# Patient Record
Sex: Male | Born: 1960 | Race: Black or African American | Hispanic: No | Marital: Married | Smoking: Never smoker
Health system: Southern US, Community
[De-identification: ages and names within clinical notes are randomized; demographics above are authoritative.]

## PROBLEM LIST (undated history)

## (undated) ENCOUNTER — Ambulatory Visit (HOSPITAL_COMMUNITY): Discharge status: Absent without leave | Payer: BC Managed Care – PPO

## (undated) DIAGNOSIS — C61 Malignant neoplasm of prostate: Secondary | ICD-10-CM

## (undated) DIAGNOSIS — I10 Essential (primary) hypertension: Secondary | ICD-10-CM

## (undated) DIAGNOSIS — G4730 Sleep apnea, unspecified: Secondary | ICD-10-CM

## (undated) HISTORY — DX: Sleep apnea, unspecified: G47.30

## (undated) HISTORY — DX: Morbid (severe) obesity due to excess calories: E66.01

## (undated) HISTORY — PX: KNEE SURGERY: SHX244

## (undated) HISTORY — PX: ESOPHAGEAL DILATION: SHX303

## (undated) HISTORY — PX: CARPAL TUNNEL RELEASE: SHX101

## (undated) HISTORY — DX: Malignant neoplasm of prostate: C61

---

## 1998-08-08 ENCOUNTER — Emergency Department (HOSPITAL_COMMUNITY): Admission: EM | Admit: 1998-08-08 | Discharge: 1998-08-08 | Payer: Self-pay | Admitting: Emergency Medicine

## 1999-02-02 ENCOUNTER — Encounter: Payer: Self-pay | Admitting: Emergency Medicine

## 1999-02-02 ENCOUNTER — Emergency Department (HOSPITAL_COMMUNITY): Admission: EM | Admit: 1999-02-02 | Discharge: 1999-02-02 | Payer: Self-pay | Admitting: Emergency Medicine

## 1999-06-25 ENCOUNTER — Ambulatory Visit (HOSPITAL_COMMUNITY): Admission: RE | Admit: 1999-06-25 | Discharge: 1999-06-25 | Payer: Self-pay | Admitting: Infectious Diseases

## 1999-06-25 ENCOUNTER — Encounter: Payer: Self-pay | Admitting: Infectious Diseases

## 2000-08-23 ENCOUNTER — Emergency Department (HOSPITAL_COMMUNITY): Admission: EM | Admit: 2000-08-23 | Discharge: 2000-08-23 | Payer: Self-pay | Admitting: Emergency Medicine

## 2000-08-23 ENCOUNTER — Encounter: Payer: Self-pay | Admitting: Emergency Medicine

## 2002-05-30 ENCOUNTER — Encounter: Payer: Self-pay | Admitting: Emergency Medicine

## 2002-05-30 ENCOUNTER — Emergency Department (HOSPITAL_COMMUNITY): Admission: EM | Admit: 2002-05-30 | Discharge: 2002-05-30 | Payer: Self-pay | Admitting: Emergency Medicine

## 2002-08-14 ENCOUNTER — Ambulatory Visit (HOSPITAL_COMMUNITY): Admission: RE | Admit: 2002-08-14 | Discharge: 2002-08-14 | Payer: Self-pay | Admitting: *Deleted

## 2003-11-05 ENCOUNTER — Emergency Department (HOSPITAL_COMMUNITY): Admission: EM | Admit: 2003-11-05 | Discharge: 2003-11-05 | Payer: Self-pay | Admitting: Emergency Medicine

## 2004-01-16 ENCOUNTER — Emergency Department (HOSPITAL_COMMUNITY): Admission: EM | Admit: 2004-01-16 | Discharge: 2004-01-16 | Payer: Self-pay | Admitting: Family Medicine

## 2004-06-01 ENCOUNTER — Emergency Department (HOSPITAL_COMMUNITY): Admission: EM | Admit: 2004-06-01 | Discharge: 2004-06-01 | Payer: Self-pay | Admitting: Family Medicine

## 2004-06-21 ENCOUNTER — Emergency Department (HOSPITAL_COMMUNITY): Admission: EM | Admit: 2004-06-21 | Discharge: 2004-06-21 | Payer: Self-pay | Admitting: Emergency Medicine

## 2004-08-13 ENCOUNTER — Ambulatory Visit (HOSPITAL_COMMUNITY): Admission: RE | Admit: 2004-08-13 | Discharge: 2004-08-13 | Payer: Self-pay | Admitting: *Deleted

## 2004-12-25 ENCOUNTER — Emergency Department (HOSPITAL_COMMUNITY): Admission: EM | Admit: 2004-12-25 | Discharge: 2004-12-25 | Payer: Self-pay | Admitting: Family Medicine

## 2005-11-03 ENCOUNTER — Emergency Department (HOSPITAL_COMMUNITY): Admission: EM | Admit: 2005-11-03 | Discharge: 2005-11-03 | Payer: Self-pay | Admitting: Emergency Medicine

## 2006-05-12 ENCOUNTER — Ambulatory Visit (HOSPITAL_BASED_OUTPATIENT_CLINIC_OR_DEPARTMENT_OTHER): Admission: RE | Admit: 2006-05-12 | Discharge: 2006-05-12 | Payer: Self-pay | Admitting: Internal Medicine

## 2006-05-15 ENCOUNTER — Ambulatory Visit: Payer: Self-pay | Admitting: Internal Medicine

## 2007-02-08 ENCOUNTER — Ambulatory Visit (HOSPITAL_BASED_OUTPATIENT_CLINIC_OR_DEPARTMENT_OTHER): Admission: RE | Admit: 2007-02-08 | Discharge: 2007-02-08 | Payer: Self-pay | Admitting: Internal Medicine

## 2007-02-12 ENCOUNTER — Ambulatory Visit: Payer: Self-pay | Admitting: Internal Medicine

## 2007-03-28 ENCOUNTER — Emergency Department (HOSPITAL_COMMUNITY): Admission: EM | Admit: 2007-03-28 | Discharge: 2007-03-28 | Payer: Self-pay | Admitting: Family Medicine

## 2007-05-18 ENCOUNTER — Inpatient Hospital Stay (HOSPITAL_COMMUNITY): Admission: EM | Admit: 2007-05-18 | Discharge: 2007-05-19 | Payer: Self-pay | Admitting: Emergency Medicine

## 2007-11-08 ENCOUNTER — Emergency Department (HOSPITAL_COMMUNITY): Admission: EM | Admit: 2007-11-08 | Discharge: 2007-11-08 | Payer: Self-pay | Admitting: Family Medicine

## 2008-04-22 ENCOUNTER — Emergency Department (HOSPITAL_COMMUNITY): Admission: EM | Admit: 2008-04-22 | Discharge: 2008-04-22 | Payer: Self-pay | Admitting: Emergency Medicine

## 2009-08-22 ENCOUNTER — Ambulatory Visit (HOSPITAL_COMMUNITY): Admission: RE | Admit: 2009-08-22 | Discharge: 2009-08-22 | Payer: Self-pay | Admitting: Gastroenterology

## 2009-08-22 ENCOUNTER — Encounter (INDEPENDENT_AMBULATORY_CARE_PROVIDER_SITE_OTHER): Payer: Self-pay | Admitting: Gastroenterology

## 2010-06-13 ENCOUNTER — Emergency Department (HOSPITAL_COMMUNITY): Admission: EM | Admit: 2010-06-13 | Discharge: 2010-06-14 | Payer: Self-pay | Admitting: Emergency Medicine

## 2010-07-02 ENCOUNTER — Emergency Department (HOSPITAL_COMMUNITY): Admission: EM | Admit: 2010-07-02 | Discharge: 2010-07-02 | Payer: Self-pay | Admitting: Emergency Medicine

## 2010-08-19 ENCOUNTER — Ambulatory Visit (HOSPITAL_BASED_OUTPATIENT_CLINIC_OR_DEPARTMENT_OTHER): Admission: RE | Admit: 2010-08-19 | Discharge: 2010-08-19 | Payer: Self-pay | Admitting: Orthopedic Surgery

## 2011-02-18 LAB — POCT HEMOGLOBIN-HEMACUE: Hemoglobin: 15.8 g/dL (ref 13.0–17.0)

## 2011-04-23 NOTE — Procedures (Signed)
Logan Contreras, Logan Contreras             ACCOUNT NO.:  [id number]   MEDICAL RECORD NO.:  [id number]          PATIENT TYPE:  OUT   LOCATION:  SLEEP CENTER                 FACILITY:  [facility]   PHYSICIAN:  Clinton D. Maple Hudson, MD, FCCP, FACPDATE OF BIRTH:  04-23-59   DATE OF STUDY:  02/08/2007                            NOCTURNAL POLYSOMNOGRAM   INDICATION FOR STUDY:  Hypersomnia with sleep apnea.   EPWORTH SLEEPINESS SCORE:  14/24, BMI 32.8, weight 240 pounds.   MEDICATIONS:  Diovan.   A baseline diagnostic study on May 12, 2006 recorded an AHI of 65.2 per  hour.  CPAP titration is requested.   SLEEP ARCHITECTURE:  Sleep architecture:  Total sleep time 282 minutes  with sleep efficiency 76%, stage I with 13%, stage II 76%, stages 3 and  4 absent.  REM 11% of total sleep time.  Sleep latency 16 minutes, REM  latency 95 minutes, awake after sleep onset 74 minutes, arousal index  21.5.  No bedtime medication was taken   RESPIRATORY DATA:  CPAP titration protocol.  CPAP was titrated to 20  CWP, AHI 0 per hour.  A medium ResMed Quattro mask was chosen with  heated humidifier.   OXYGEN DATA:  Moderate snoring was suppressed by CPAP.  Oxygen  saturation with CPAP control was at 95% on room air.   CARDIAC DATA:  Normal sinus rhythm.   MOVEMENT-PARASOMNIA:  Occasional limb jerk with little effect on sleep.   IMPRESSIONS-RECOMMENDATIONS:  1. Successful CPAP titration to 20 CWP, AHI 0 per hour.  A medium      ResMed Quattro mask was used with heated humidifier.  2. Baseline diagnostic NPSG on May 12, 2006 had recorded and AHI of      65.2 per hour.  3. The patient's cell phone rang at 3:20 a.m., and again at 3:42 a.m.      and 4:05 a.m., after which he had difficulty      sleeping.  It rang finally again at 5:00 a.m.  Suggest discussion      of appropriate sleep hygiene measures with the patient.      Clinton D. Maple Hudson, MD, [facility], FACP  Diplomate, Biomedical engineer of Sleep Medicine  Electronically Signed     CDY/MEDQ  D:  02/12/2007 11:09:07  T:  02/12/2007 18:33:11  Job:  [id number]

## 2011-04-23 NOTE — Discharge Summary (Signed)
NAMELANDY, Logan Contreras             ACCOUNT NO.:  [id number]   MEDICAL RECORD NO.:  [id number]          PATIENT TYPE:  INP   LOCATION:  4735                         FACILITY:  [facility]   PHYSICIAN:  Fleet Contras, M.D.    DATE OF BIRTH:  Dec 27, 1960   DATE OF ADMISSION:  05/18/2007  DATE OF DISCHARGE:  05/19/2007                               DISCHARGE SUMMARY   HISTORY OF PRESENT ILLNESS:  This is a 50 year old African-American  gentleman with a past medical history significant for hypertension,  gastroesophageal reflux disease,  sleep apnea, obesity.  He came to the  emergency room straight from where he works with a one-day history of  chest pain which started while he was working in the business and the  pain progressively got worse.  There was no radiating.  It was worse  with any movement of the breathing.  There was no nausea, no vomiting,  diaphoresis or palpitations.  He had no cough, no production, fevers or  chills, no dizziness, syncope, speech or vision problems, extremity  weakness.  He stated that he had been working out at the gym lifting  some weights and his job also involves heavy lifting, buffering and  cleaning floors.  In the emergency room, he received IV morphine with  some pain relief.  However, his blood pressure was slightly elevated,  therefore, he was admitted to the hospital to rule out acute coronary  syndrome.   HOSPITAL COURSE:  On admission, his blood pressure is 136/72, heart rate  was 74 and regular, respiratory rate of 20, temperature 98.4, O2 sat was  96%.  He was tender over the left chest wall below the nipple.  No  obvious swelling or deformity.  His laboratory data showed a sodium of  139, potassium 3.7, chloride 105, bicarb of 27, BUN 17, creatinine 1.3,  glucose of 92.  White count was 5.4, hemoglobin 15.5, hematocrit 42,  platelets was 210,000.  His CPK was 553, BTNP was 4 and troponin was  less than 0.05, going down to 0.01.  He was therefore  felt to have  atypical chest pain likely associated with recent heavy lifting,  however, because of his multiple risk factors for coronary artery  disease, he was started on aspirin, low-dose beta blocker and cardiology  was requested for stress test.  The patient was kindly see by Dr. Yates Decamp, who recommended that patient should have a stress test done as an  outpatient.  He was stable for discharge as ACS was ruled out.   DISCHARGE DIAGNOSES:  1. Atypical chest pain.  2. Multiple coronary artery risk factors.  3. Hypertension.  4. History of sleep apnea.   DISCHARGE MEDICATIONS:  Discharge medications will be:  1. Diovan 160 mg daily.  2. Aspirin 81 mg daily.  3. Toprol XL 25 mg daily.   FOLLOW UP:  He is to follow-up with me in about one to two weeks or so  with Dr. Yates Decamp for outpatient Myoview.  The discharge plan was  discussed with him and his questions were answered.  Fleet Contras, M.D.  Electronically Signed     EA/MEDQ  D:  06/26/2007  T:  06/27/2007  Job:  [id number]   cc:   Cristy Hilts. Jacinto Halim, MD

## 2011-04-23 NOTE — Procedures (Signed)
NAME:  Logan Contreras, Logan Contreras             ACCOUNT NO.:  [id number]   MEDICAL RECORD NO.:  [id number]          PATIENT TYPE:  OUT   LOCATION:  SLEEP CENTER                 FACILITY:  [facility]   PHYSICIAN:  Clinton D. Maple Hudson, M.D. DATE OF BIRTH:  11-09-1961   DATE OF STUDY:                              NOCTURNAL POLYSOMNOGRAM   REFERRING PHYSICIAN:  Dr. Fleet Contras.   INDICATIONS FOR STUDY:  Insomnia with sleep apnea.   EPWORTH SLEEPINESS SCORE:  15/24.   BMI:  35.   WEIGHT:  254 pounds.   No medications were listed.   SLEEP ARCHITECTURE:  Total sleep time 396 minutes with sleep efficiency 96%.  Stage I was 7%, stage II 75% stages III and IV were absent, REM 17% of total  sleep time.  Sleep latency 7 minutes, REM latency 113 minutes, awake after  sleep onset 12 minutes, arousal index increased at 57.8 indicating increased  fragmentation.  No bedtime medication was reported.   RESPIRATORY DATA:  Diagnostic NPSG protocol was ordered.  Apnea/hypopnea  index (AHI, RDI) 65.2 obstructive events per hour indicating severe  obstructive sleep apnea/hypopnea syndrome.  This reflected 290 obstructive  apneas and 141 hypopneas.  Most events occurred while sleeping supine but  significant numbers were also noted while on his sides.  REM AHI 80.6 per  hour.   OXYGEN DATA:  Moderately loud snoring and mouth breathing with oxygen  desaturation to a nadir of 69%.  Mean oxygen saturation through the study  was 92% on room air.   CARDIAC DATA:  Normal sinus rhythm.   MOVEMENT/PARASOMNIA:  Occasional limb jerk, insignificant.  No bathroom  trips.   IMPRESSION/RECOMMENDATIONS:  1.  Severe obstructive sleep apnea/hypopnea syndrome, AHI 65.2 per hour with      events more common but not      limited to supine sleep position.  Moderately loud snoring with oxygen      desaturation to a nadir of 69%.  2.  Consider return for CPAP titration or evaluate for alternative therapies      as  appropriate.      Clinton D. Maple Hudson, M.D.  Diplomate, Biomedical engineer of Sleep Medicine  Electronically Signed     CDY/MEDQ  D:  05/15/2006 09:35:01  T:  05/16/2006 07:39:17  Job:  [id number]

## 2011-04-23 NOTE — Procedures (Signed)
NAME:  MCKENZIE, TORUNO             ACCOUNT NO.:  [id number]   MEDICAL RECORD NO.:  [id number]          PATIENT TYPE:  OUT   LOCATION:  SLEEP CENTER                 FACILITY:  [facility]   PHYSICIAN:  Clinton D. Maple Hudson, M.D. DATE OF BIRTH:  1961-03-07   DATE OF STUDY:                              NOCTURNAL POLYSOMNOGRAM   Audio too short to transcribe (less than 5 seconds)      Clinton D. Maple Hudson, M.D.  Diplomate, American Board of Sleep Medicine     CDY/MEDQ  D:  05/15/2006 09:31:02  T:  05/15/2006 09:31:58  Job:  [id number]

## 2011-06-23 ENCOUNTER — Emergency Department (HOSPITAL_COMMUNITY)
Admission: EM | Admit: 2011-06-23 | Discharge: 2011-06-23 | Disposition: A | Discharge status: Home / Self Care | Payer: No Typology Code available for payment source | Attending: Emergency Medicine | Admitting: Emergency Medicine

## 2011-06-23 DIAGNOSIS — Y92410 Unspecified street and highway as the place of occurrence of the external cause: Secondary | ICD-10-CM | POA: Insufficient documentation

## 2011-06-23 DIAGNOSIS — S335XXA Sprain of ligaments of lumbar spine, initial encounter: Secondary | ICD-10-CM | POA: Insufficient documentation

## 2011-09-01 LAB — POCT CARDIAC MARKERS
Myoglobin, poc: 213
Operator id: 173591
Operator id: 196461
Troponin i, poc: <0.05

## 2011-09-01 LAB — POCT I-STAT, CHEM 8
BUN: 15
Calcium, Ion: 1.06 — ABNORMAL LOW
Glucose, Bld: 90
HCT: 46
Potassium: 3.8
Sodium: 138
TCO2: 27

## 2011-09-01 LAB — CBC
MCHC: 33.4
MCV: 83.5
RBC: 5.22
RDW: 13.8

## 2011-09-01 LAB — DIFFERENTIAL
Basophils Absolute: 0
Eosinophils Absolute: 0.5
Lymphocytes Relative: 39
Neutro Abs: 2.2

## 2011-09-23 LAB — URINALYSIS, ROUTINE W REFLEX MICROSCOPIC
Bilirubin Urine: NEGATIVE
Glucose, UA: NEGATIVE
Hgb urine dipstick: NEGATIVE
Ketones, ur: NEGATIVE
Protein, ur: NEGATIVE
Specific Gravity, Urine: 1.016
pH: 6

## 2011-09-23 LAB — DIFFERENTIAL
Basophils Relative: 1
Eosinophils Relative: 8 — ABNORMAL HIGH
Lymphs Abs: 1.7
Monocytes Relative: 7
Neutro Abs: 2.9

## 2011-09-23 LAB — LIPID PANEL
Cholesterol: 129
HDL: 25 — ABNORMAL LOW
LDL Cholesterol: 87
Total CHOL/HDL Ratio: 5.2
Triglycerides: 84
VLDL: 17

## 2011-09-23 LAB — TSH: TSH: 1.403

## 2011-09-23 LAB — TROPONIN I: Troponin I: 0.01

## 2011-09-23 LAB — POCT I-STAT CREATININE: Creatinine, Ser: 1.3

## 2011-09-23 LAB — I-STAT 8, (EC8 V) (CONVERTED LAB)
Acid-Base Excess: 1
Bicarbonate: 27.1 — ABNORMAL HIGH
Operator id: 234111
TCO2: 29
pCO2, Ven: 48.6
pH, Ven: 7.354 — ABNORMAL HIGH

## 2011-09-23 LAB — CBC
HCT: 40.2
Hemoglobin: 13.5
MCHC: 33.6
RDW: 13.7
WBC: 5.4

## 2011-09-23 LAB — CARDIAC PANEL(CRET KIN+CKTOT+MB+TROPI)
CK, MB: 4.2 — ABNORMAL HIGH
Relative Index: 1.1
Troponin I: 0.01

## 2011-09-23 LAB — APTT: aPTT: 33

## 2011-09-23 LAB — PROTIME-INR: INR: 1

## 2013-12-10 ENCOUNTER — Encounter (HOSPITAL_COMMUNITY): Payer: Self-pay | Admitting: Emergency Medicine

## 2013-12-10 ENCOUNTER — Emergency Department (INDEPENDENT_AMBULATORY_CARE_PROVIDER_SITE_OTHER)
Admission: EM | Admit: 2013-12-10 | Discharge: 2013-12-10 | Disposition: A | Discharge status: Home / Self Care | Payer: BC Managed Care – PPO | Source: Home / Self Care

## 2013-12-10 DIAGNOSIS — G4733 Obstructive sleep apnea (adult) (pediatric): Secondary | ICD-10-CM

## 2013-12-10 DIAGNOSIS — I10 Essential (primary) hypertension: Secondary | ICD-10-CM

## 2013-12-10 NOTE — ED Notes (Signed)
C/o hypertension States he was taking Diovan 180/12.5  QD but has been off for years Patient states he stopped the medication himself

## 2013-12-10 NOTE — Discharge Instructions (Signed)
Hypertension As your heart beats, it forces blood through your arteries. This force is your blood pressure. If the pressure is too high, it is called hypertension (HTN) or high blood pressure. HTN is dangerous because you may have it and not know it. High blood pressure may mean that your heart has to work harder to pump blood. Your arteries may be narrow or stiff. The extra work puts you at risk for heart disease, stroke, and other problems.  Blood pressure consists of two numbers, a higher number over a lower, 110/72, for example. It is stated as "110 over 72." The ideal is below 120 for the top number (systolic) and under 80 for the bottom (diastolic). Write down your blood pressure today. You should pay close attention to your blood pressure if you have certain conditions such as:  Heart failure.  Prior heart attack.  Diabetes  Chronic kidney disease.  Prior stroke.  Multiple risk factors for heart disease. To see if you have HTN, your blood pressure should be measured while you are seated with your arm held at the level of the heart. It should be measured at least twice. A one-time elevated blood pressure reading (especially in the Emergency Department) does not mean that you need treatment. There may be conditions in which the blood pressure is different between your right and left arms. It is important to see your caregiver soon for a recheck. Most people have essential hypertension which means that there is not a specific cause. This type of high blood pressure may be lowered by changing lifestyle factors such as:  Stress.  Smoking.  Lack of exercise.  Excessive weight.  Drug/tobacco/alcohol use.  Eating less salt. Most people do not have symptoms from high blood pressure until it has caused damage to the body. Effective treatment can often prevent, delay or reduce that damage. TREATMENT  When a cause has been identified, treatment for high blood pressure is directed at the  cause. There are a large number of medications to treat HTN. These fall into several categories, and your caregiver will help you select the medicines that are best for you. Medications may have side effects. You should review side effects with your caregiver. If your blood pressure stays high after you have made lifestyle changes or started on medicines,   Your medication(s) may need to be changed.  Other problems may need to be addressed.  Be certain you understand your prescriptions, and know how and when to take your medicine.  Be sure to follow up with your caregiver within the time frame advised (usually within two weeks) to have your blood pressure rechecked and to review your medications.  If you are taking more than one medicine to lower your blood pressure, make sure you know how and at what times they should be taken. Taking two medicines at the same time can result in blood pressure that is too low. SEEK IMMEDIATE MEDICAL CARE IF:  You develop a severe headache, blurred or changing vision, or confusion.  You have unusual weakness or numbness, or a faint feeling.  You have severe chest or abdominal pain, vomiting, or breathing problems. MAKE SURE YOU:   Understand these instructions.  Will watch your condition.  Will get help right away if you are not doing well or get worse. Document Released: 11/22/2005 Document Revised: 02/14/2012 Document Reviewed: 07/12/2008 [facility] Patient Information 2014 [place].  Sleep Apnea  Sleep apnea is a sleep disorder characterized by abnormal pauses in breathing  while you sleep. When your breathing pauses, the level of oxygen in your blood decreases. This causes you to move out of deep sleep and into light sleep. As a result, your quality of sleep is poor, and the system that carries your blood throughout your body (cardiovascular system) experiences stress. If sleep apnea remains untreated, the following conditions can  develop:  High blood pressure (hypertension).  Coronary artery disease.  Inability to achieve or maintain an erection (impotence).  Impairment of your thought process (cognitive dysfunction). There are three types of sleep apnea: 1. Obstructive sleep apnea Pauses in breathing during sleep because of a blocked airway. 2. Central sleep apnea Pauses in breathing during sleep because the area of the brain that controls your breathing does not send the correct signals to the muscles that control breathing. 3. Mixed sleep apnea A combination of both obstructive and central sleep apnea. RISK FACTORS The following risk factors can increase your risk of developing sleep apnea:  Being overweight.  Smoking.  Having narrow passages in your nose and throat.  Being of older age.  Being male.  Alcohol use.  Sedative and tranquilizer use.  Ethnicity. Among individuals younger than 35 years, African Americans are at increased risk of sleep apnea. SYMPTOMS   Difficulty staying asleep.  Daytime sleepiness and fatigue.  Loss of energy.  Irritability.  Loud, heavy snoring.  Morning headaches.  Trouble concentrating.  Forgetfulness.  Decreased interest in sex. DIAGNOSIS  In order to diagnose sleep apnea, your caregiver will perform a physical examination. Your caregiver may suggest that you take a home sleep test. Your caregiver may also recommend that you spend the night in a sleep lab. In the sleep lab, several monitors record information about your heart, lungs, and brain while you sleep. Your leg and arm movements and blood oxygen level are also recorded. TREATMENT The following actions may help to resolve mild sleep apnea:  Sleeping on your side.   Using a decongestant if you have nasal congestion.   Avoiding the use of depressants, including alcohol, sedatives, and narcotics.   Losing weight and modifying your diet if you are overweight. There also are devices and  treatments to help open your airway:  Oral appliances. These are custom-made mouthpieces that shift your lower jaw forward and slightly open your bite. This opens your airway.  Devices that create positive airway pressure. This positive pressure "splints" your airway open to help you breathe better during sleep. The following devices create positive airway pressure:  Continuous positive airway pressure (CPAP) device. The CPAP device creates a continuous level of air pressure with an air pump. The air is delivered to your airway through a mask while you sleep. This continuous pressure keeps your airway open.  Nasal expiratory positive airway pressure (EPAP) device. The EPAP device creates positive air pressure as you exhale. The device consists of single-use valves, which are inserted into each nostril and held in place by adhesive. The valves create very little resistance when you inhale but create much more resistance when you exhale. That increased resistance creates the positive airway pressure. This positive pressure while you exhale keeps your airway open, making it easier to breath when you inhale again.  Bilevel positive airway pressure (BPAP) device. The BPAP device is used mainly in patients with central sleep apnea. This device is similar to the CPAP device because it also uses an air pump to deliver continuous air pressure through a mask. However, with the BPAP machine, the pressure is  set at two different levels. The pressure when you exhale is lower than the pressure when you inhale.  Surgery. Typically, surgery is only done if you cannot comply with less invasive treatments or if the less invasive treatments do not improve your condition. Surgery involves removing excess tissue in your airway to create a wider passage way. Document Released: 11/12/2002 Document Revised: 03/19/2013 Document Reviewed: 03/30/2012 [facility] Patient Information 2014 [place], [place].  Obesity Obesity is  defined as having too much total body fat and a body mass index (BMI) of 30 or more. BMI is an estimate of body fat and is calculated from your height and weight. Obesity happens when you consume more calories than you can burn by exercising or performing daily physical tasks. Prolonged obesity can cause major illnesses or emergencies, such as:   A stroke.  Heart disease.  Diabetes.  Cancer.  Arthritis.  High blood pressure (hypertension).  High cholesterol.  Sleep apnea.  Erectile dysfunction.  Infertility problems. CAUSES   Regularly eating unhealthy foods.  Physical inactivity.  Certain disorders, such as an underactive thyroid (hypothyroidism), Cushing's syndrome, and polycystic ovarian syndrome.  Certain medicines, such as steroids, some depression medicines, and antipsychotics.  Genetics.  Lack of sleep. DIAGNOSIS  A caregiver can diagnose obesity after calculating your BMI. Obesity will be diagnosed if your BMI is 30 or higher.  There are other methods of measuring obesity levels. Some other methods include measuring your skin fold thickness, your waist circumference, and comparing your hip circumference to your waist circumference. TREATMENT  A healthy treatment program includes some or all of the following:  Long-term dietary changes.  Exercise and physical activity.  Behavioral and lifestyle changes.  Medicine only under the supervision of your caregiver. Medicines may help, but only if they are used with diet and exercise programs. An unhealthy treatment program includes:  Fasting.  Fad diets.  Supplements and drugs. These choices do not succeed in long-term weight control.  HOME CARE INSTRUCTIONS   Exercise and perform physical activity as directed by your caregiver. To increase physical activity, try the following:  Use stairs instead of elevators.  Park farther away from store entrances.  Garden, bike, or walk instead of watching television  or using the computer.  Eat healthy, low-calorie foods and drinks on a regular basis. Eat more fruits and vegetables. Use low-calorie cookbooks or take healthy cooking classes.  Limit fast food, sweets, and processed snack foods.  Eat smaller portions.  Keep a daily journal of everything you eat. There are many free websites to help you with this. It may be helpful to measure your foods so you can determine if you are eating the correct portion sizes.  Avoid drinking alcohol. Drink more water and drinks without calories.  Take vitamins and supplements only as recommended by your caregiver.  Weight-loss support groups, Optometristegistered Dieticians, counselors, and stress reduction education can also be very helpful. SEEK IMMEDIATE MEDICAL CARE IF:  You have chest pain or tightness.  You have trouble breathing or feel short of breath.  You have weakness or leg numbness.  You feel confused or have trouble talking.  You have sudden changes in your vision. MAKE SURE YOU:  Understand these instructions.  Will watch your condition.  Will get help right away if you are not doing well or get worse. Document Released: 12/30/2004 Document Revised: 05/23/2012 Document Reviewed: 12/29/2011 [facility] Patient Information 2014 [place], [place].

## 2013-12-10 NOTE — ED Provider Notes (Signed)
CSN: [id number]     Arrival date & time 12/10/13  3 History   First MD Initiated Contact with Patient 12/10/13 1759     Chief Complaint  Patient presents with  . Hypertension   (Consider location/radiation/quality/duration/timing/severity/associated sxs/prior Treatment) HPI Comments: Morbidly obese male presents to the urgent care so he can have blood pressure medicine started and a prescription for a new CPAP machine. He had a physician on [employer] but has not seen him and "a while which the best I can tell is at least a couple of years. He was on Diovan as above all for several years. Since his CPAP machine is broken he has been having more headaches recently.  Patient is a 53 y.o. male presenting with hypertension.  Hypertension Associated symptoms include headaches.    History reviewed. No pertinent past medical history. No past surgical history on file. No family history on file. History  Substance Use Topics  . Smoking status: Not on file  . Smokeless tobacco: Not on file  . Alcohol Use: Not on file    Review of Systems  Constitutional: Negative.   Neurological: Positive for headaches.  All other systems reviewed and are negative.    Allergies  Review of patient's allergies indicates no known allergies.  Home Medications  No current outpatient prescriptions on file. BP 152/93  Pulse 90  Temp(Src) 97.9 F (36.6 C) (Oral)  Resp 18  SpO2 99% Physical Exam  Nursing note and vitals reviewed. Constitutional: He is oriented to person, place, and time. He appears well-developed. No distress.  Morbidly obese  Eyes: Conjunctivae and EOM are normal.  Neck: Normal range of motion. Neck supple.  Cardiovascular: Normal rate, regular rhythm and normal heart sounds.   Pulmonary/Chest: Effort normal and breath sounds normal. No respiratory distress. He has no wheezes.  Neurological: He is alert and oriented to person, place, and time. He exhibits normal muscle  tone.  Skin: Skin is warm and dry.  Psychiatric: He has a normal mood and affect.    ED Course  Procedures (including critical care time) Labs Review Labs Reviewed - No data to display Imaging Review No results found.    MDM   1. HTN (hypertension)   2. Morbid obesity   3. Sleep apnea, obstructive    Call the physician that you were seen earlier to make an appointment as soon as possible.       Janne Napoleon, NP 12/10/13 [phone]

## 2013-12-11 NOTE — ED Provider Notes (Signed)
Medical screening examination/treatment/procedure(s) were performed by resident physician or non-physician practitioner and as supervising physician I was immediately available for consultation/collaboration.   Prynce Jacober DOUGLAS MD.   Kamia Insalaco D Markeise Mathews, MD 12/11/13 0850 

## 2014-01-04 ENCOUNTER — Emergency Department (HOSPITAL_COMMUNITY)
Admission: EM | Admit: 2014-01-04 | Discharge: 2014-01-04 | Disposition: A | Discharge status: Home / Self Care | Payer: BC Managed Care – PPO | Attending: Emergency Medicine | Admitting: Emergency Medicine

## 2014-01-04 ENCOUNTER — Encounter (HOSPITAL_COMMUNITY): Payer: Self-pay | Admitting: Emergency Medicine

## 2014-01-04 DIAGNOSIS — I10 Essential (primary) hypertension: Secondary | ICD-10-CM | POA: Insufficient documentation

## 2014-01-04 DIAGNOSIS — R51 Headache: Secondary | ICD-10-CM | POA: Insufficient documentation

## 2014-01-04 DIAGNOSIS — R110 Nausea: Secondary | ICD-10-CM | POA: Insufficient documentation

## 2014-01-04 DIAGNOSIS — M542 Cervicalgia: Secondary | ICD-10-CM | POA: Insufficient documentation

## 2014-01-04 HISTORY — DX: Essential (primary) hypertension: I10

## 2014-01-04 LAB — CBC WITH DIFFERENTIAL/PLATELET
BASOS ABS: 0 10*3/uL (ref 0.0–0.1)
Basophils Relative: 1 % (ref 0–1)
Eosinophils Absolute: 0.4 10*3/uL (ref 0.0–0.7)
Eosinophils Relative: 7 % — ABNORMAL HIGH (ref 0–5)
HEMATOCRIT: 44.1 % (ref 39.0–52.0)
HEMOGLOBIN: 15.6 g/dL (ref 13.0–17.0)
LYMPHS PCT: 36 % (ref 12–46)
Lymphs Abs: 1.9 10*3/uL (ref 0.7–4.0)
MCH: 28.6 pg (ref 26.0–34.0)
MCHC: 35.4 g/dL (ref 30.0–36.0)
MCV: 80.8 fL (ref 78.0–100.0)
MONO ABS: 0.3 10*3/uL (ref 0.1–1.0)
MONOS PCT: 6 % (ref 3–12)
NEUTROS ABS: 2.7 10*3/uL (ref 1.7–7.7)
Neutrophils Relative %: 50 % (ref 43–77)
Platelets: 193 10*3/uL (ref 150–400)
RBC: 5.46 MIL/uL (ref 4.22–5.81)
RDW: 13.5 % (ref 11.5–15.5)
WBC: 5.3 10*3/uL (ref 4.0–10.5)

## 2014-01-04 LAB — BASIC METABOLIC PANEL
BUN: 11 mg/dL (ref 6–23)
CHLORIDE: 99 meq/L (ref 96–112)
CO2: 28 meq/L (ref 19–32)
CREATININE: 0.92 mg/dL (ref 0.50–1.35)
Calcium: 9.5 mg/dL (ref 8.4–10.5)
GFR calc Af Amer: >90 mL/min (ref >90)
GFR calc non Af Amer: >90 mL/min (ref >90)
Glucose, Bld: 125 mg/dL — ABNORMAL HIGH (ref 70–99)
POTASSIUM: 3.9 meq/L (ref 3.7–5.3)
Sodium: 138 mEq/L (ref 137–147)

## 2014-01-04 MED ORDER — METOPROLOL SUCCINATE ER 25 MG PO TB24: 25.0000 mg | ORAL_TABLET | Freq: Every day | ORAL | Status: DC

## 2014-01-04 NOTE — ED Notes (Signed)
Pt reporting yesterday he took his bp at home and it was high and he had a headache. Today he reports he is feeling better because he took a vicodin last night. Has not checked his bp and is wanting a rx refill of Diovan. Left knee surgery 12/12/13. Pt is ambulatory to room. BP 163/97. No neuro deficits at current, denies headache or vision issues.

## 2014-01-04 NOTE — Discharge Instructions (Signed)
Arterial Hypertension °Arterial hypertension (high blood pressure) is a condition of elevated pressure in your blood vessels. Hypertension over a long period of time is a risk factor for strokes, heart attacks, and heart failure. It is also the leading cause of kidney (renal) failure.  °CAUSES  °· In Adults -- Over 90% of all hypertension has no known cause. This is called essential or primary hypertension. In the other 10% of people with hypertension, the increase in blood pressure is caused by another disorder. This is called secondary hypertension. Important causes of secondary hypertension are: °· Heavy alcohol use. °· Obstructive sleep apnea. °· Hyperaldosterosim (Conn's syndrome). °· Steroid use. °· Chronic kidney failure. °· Hyperparathyroidism. °· Medications. °· Renal artery stenosis. °· Pheochromocytoma. °· Cushing's disease. °· Coarctation of the aorta. °· Scleroderma renal crisis. °· Licorice (in excessive amounts). °· Drugs (cocaine, methamphetamine). °Your caregiver can explain any items above that apply to you. °· In Children -- Secondary hypertension is more common and should always be considered. °· Pregnancy -- Few women of childbearing age have high blood pressure. However, up to 10% of them develop hypertension of pregnancy. Generally, this will not harm the woman. It may be a sign of 3 complications of pregnancy: preeclampsia, HELLP syndrome, and eclampsia. Follow up and control with medication is necessary. °SYMPTOMS  °· This condition normally does not produce any noticeable symptoms. It is usually found during a routine exam. °· Malignant hypertension is a late problem of high blood pressure. It may have the following symptoms: °· Headaches. °· Blurred vision. °· End-organ damage (this means your kidneys, heart, lungs, and other organs are being damaged). °· Stressful situations can increase the blood pressure. If a person with normal blood pressure has their blood pressure go up while being  seen by their caregiver, this is often termed "white coat hypertension." Its importance is not known. It may be related with eventually developing hypertension or complications of hypertension. °· Hypertension is often confused with mental tension, stress, and anxiety. °DIAGNOSIS  °The diagnosis is made by 3 separate blood pressure measurements. They are taken at least 1 week apart from each other. If there is organ damage from hypertension, the diagnosis may be made without repeat measurements. °Hypertension is usually identified by having blood pressure readings: °· Above 140/90 mmHg measured in both arms, at 3 separate times, over a couple weeks. °· Over 130/80 mmHg should be considered a risk factor and may require treatment in patients with diabetes. °Blood pressure readings over 120/80 mmHg are called "pre-hypertension" even in non-diabetic patients. °To get a true blood pressure measurement, use the following guidelines. Be aware of the factors that can alter blood pressure readings. °· Take measurements at least 1 hour after caffeine. °· Take measurements 30 minutes after smoking and without any stress. This is another reason to quit smoking  it raises your blood pressure. °· Use a proper cuff size. Ask your caregiver if you are not sure about your cuff size. °· Most home blood pressure cuffs are automatic. They will measure systolic and diastolic pressures. The systolic pressure is the pressure reading at the start of sounds. Diastolic pressure is the pressure at which the sounds disappear. If you are elderly, measure pressures in multiple postures. Try sitting, lying or standing. °· Sit at rest for a minimum of 5 minutes before taking measurements. °· You should not be on any medications like decongestants. These are found in many cold medications. °· Record your blood pressure readings and review   them with your caregiver. °If you have hypertension: °· Your caregiver may do tests to be sure you do not have  secondary hypertension (see "causes" above). °· Your caregiver may also look for signs of metabolic syndrome. This is also called Syndrome X or Insulin Resistance Syndrome. You may have this syndrome if you have type 2 diabetes, abdominal obesity, and abnormal blood lipids in addition to hypertension. °· Your caregiver will take your medical and family history and perform a physical exam. °· Diagnostic tests may include blood tests (for glucose, cholesterol, potassium, and kidney function), a urinalysis, or an EKG. Other tests may also be necessary depending on your condition. °PREVENTION  °There are important lifestyle issues that you can adopt to reduce your chance of developing hypertension: °· Maintain a normal weight. °· Limit the amount of salt (sodium) in your diet. °· Exercise often. °· Limit alcohol intake. °· Get enough potassium in your diet. Discuss specific advice with your caregiver. °· Follow a DASH diet (dietary approaches to stop hypertension). This diet is rich in fruits, vegetables, and low-fat dairy products, and avoids certain fats. °PROGNOSIS  °Essential hypertension cannot be cured. Lifestyle changes and medical treatment can lower blood pressure and reduce complications. The prognosis of secondary hypertension depends on the underlying cause. Many people whose hypertension is controlled with medicine or lifestyle changes can live a normal, healthy life.  °RISKS AND COMPLICATIONS  °While high blood pressure alone is not an illness, it often requires treatment due to its short- and long-term effects on many organs. Hypertension increases your risk for: °· CVAs or strokes (cerebrovascular accident). °· Heart failure due to chronically high blood pressure (hypertensive cardiomyopathy). °· Heart attack (myocardial infarction). °· Damage to the retina (hypertensive retinopathy). °· Kidney failure (hypertensive nephropathy). °Your caregiver can explain list items above that apply to you. Treatment  of hypertension can significantly reduce the risk of complications. °TREATMENT  °· For overweight patients, weight loss and regular exercise are recommended. Physical fitness lowers blood pressure. °· Mild hypertension is usually treated with diet and exercise. A diet rich in fruits and vegetables, fat-free dairy products, and foods low in fat and salt (sodium) can help lower blood pressure. Decreasing salt intake decreases blood pressure in a 1/3 of people. °· Stop smoking if you are a smoker. °The steps above are highly effective in reducing blood pressure. While these actions are easy to suggest, they are difficult to achieve. Most patients with moderate or severe hypertension end up requiring medications to bring their blood pressure down to a normal level. There are several classes of medications for treatment. Blood pressure pills (antihypertensives) will lower blood pressure by their different actions. Lowering the blood pressure by 10 mmHg may decrease the risk of complications by as much as 25%. °The goal of treatment is effective blood pressure control. This will reduce your risk for complications. Your caregiver will help you determine the best treatment for you according to your lifestyle. What is excellent treatment for one person, may not be for you. °HOME CARE INSTRUCTIONS  °· Do not smoke. °· Follow the lifestyle changes outlined in the "Prevention" section. °· If you are on medications, follow the directions carefully. Blood pressure medications must be taken as prescribed. Skipping doses reduces their benefit. It also puts you at risk for problems. °· Follow up with your caregiver, as directed. °· If you are asked to monitor your blood pressure at home, follow the guidelines in the "Diagnosis" section above. °SEEK MEDICAL CARE   IF:  °· You think you are having medication side effects. °· You have recurrent headaches or lightheadedness. °· You have swelling in your ankles. °· You have trouble with  your vision. °SEEK IMMEDIATE MEDICAL CARE IF:  °· You have sudden onset of chest pain or pressure, difficulty breathing, or other symptoms of a heart attack. °· You have a severe headache. °· You have symptoms of a stroke (such as sudden weakness, difficulty speaking, difficulty walking). °MAKE SURE YOU:  °· Understand these instructions. °· Will watch your condition. °· Will get help right away if you are not doing well or get worse. °Document Released: 11/22/2005 Document Revised: 02/14/2012 Document Reviewed: 06/22/2007 °[employer]® Patient Information ©2014 [employer]. ° °

## 2014-01-04 NOTE — ED Provider Notes (Signed)
CSN: [id number]     Arrival date & time 01/04/14  1237 History   First MD Initiated Contact with Patient 01/04/14 1300     Chief Complaint  Patient presents with  . Hypertension   (Consider location/radiation/quality/duration/timing/severity/associated sxs/prior Treatment) HPI Comments: 53 yo male presents to the emergency department today with the complaint of "hypertension" and a headache yesterday, he also is complaining about not sleeping due to broken CPAP and he has been unable to get into his PCP, Dr. Betsy Coder.  He went this morning and they said they could see him in the clinic Monday.  His headache came on gradually through the day yesterday, and the pain was located in the back of his neck bilaterally, and was throbbing and sharp, with associated nausea.  He took Vicodin three times, every 4 hours, last dose this morning at 4am and reports no headache at this time.  Pt is asking for hypertension medicine so his dentist will extract a tooth.  Patient is a 53 y.o. male presenting with hypertension. The history is provided by the patient.  Hypertension Associated symptoms include headaches. Pertinent negatives include no chest pain, no abdominal pain and no shortness of breath.    Past Medical History  Diagnosis Date  . Hypertension    Past Surgical History  Procedure Laterality Date  . Knee surgery     No family history on file. History  Substance Use Topics  . Smoking status: Never Smoker   . Smokeless tobacco: Not on file  . Alcohol Use: Yes    Review of Systems  Constitutional: Negative for activity change and appetite change.  Respiratory: Negative for cough and shortness of breath.   Cardiovascular: Negative for chest pain.  Gastrointestinal: Negative for abdominal pain.  Genitourinary: Negative for dysuria.  Neurological: Positive for headaches.    Allergies  Review of patient's allergies indicates no known allergies.  Home Medications   Current Outpatient Rx   Name  Route  Sig  Dispense  Refill  . HYDROcodone-acetaminophen (NORCO) 10-325 MG per tablet   Oral   Take 1 tablet by mouth every 6 (six) hours as needed.         . methocarbamol (ROBAXIN) 500 MG tablet   Oral   Take 500 mg by mouth every 6 (six) hours as needed for muscle spasms.         . penicillin v potassium (VEETID) 500 MG tablet   Oral   Take 500 mg by mouth every 6 (six) hours as needed. For 10 days          BP 160/86  Pulse 81  Temp(Src) 97.7 F (36.5 C) (Oral)  Resp 24  Ht 5\' 9"  (1.753 m)  Wt 265 lb (120.203 kg)  BMI 39.12 kg/m2  SpO2 93% Physical Exam  Nursing note and vitals reviewed. Constitutional: He is oriented to person, place, and time. He appears well-developed.  HENT:  Head: Normocephalic and atraumatic.  Eyes: Conjunctivae and EOM are normal. Pupils are equal, round, and reactive to light.  Neck: Normal range of motion. Neck supple.  Cardiovascular: Normal rate and regular rhythm.   Pulmonary/Chest: Effort normal and breath sounds normal.  Abdominal: Soft. Bowel sounds are normal. He exhibits no distension. There is no tenderness. There is no rebound and no guarding.  Neurological: He is alert and oriented to person, place, and time.  Skin: Skin is warm.    ED Course  Procedures (including critical care time) Labs Review Labs Reviewed  CBC  WITH DIFFERENTIAL - Abnormal; Notable for the following:    Eosinophils Relative 7 (*)    All other components within normal limits  BASIC METABOLIC PANEL - Abnormal; Notable for the following:    Glucose, Bld 125 (*)    All other components within normal limits   Imaging Review No results found.  EKG Interpretation    Date/Time:  Friday January 04 2014 12:39:43 EST Ventricular Rate:  91 PR Interval:  134 QRS Duration: 98 QT Interval:  338 QTC Calculation: 415 R Axis:   -25 Text Interpretation:  Normal sinus rhythm Incomplete right bundle branch block Borderline ECG Confirmed by Denya Buckingham, MD,  Skarlette Lattner ([id number]) on 01/04/2014 2:17:37 PM            MDM  No diagnosis found.  PT comes in with cc of HTN. Pt has essential HTN - there is no HTN urgency.  We will give him some toprol 25 mg. Records indicate he was on toprol 25 mg, diovan 160 mg. He has lost weight since then, and states that he himself stopped meds, and his BP was normal - until recently. Will d/c. PCP f/u options provided, as he is unable to get in to see his pcp.    Varney Biles, MD 01/04/14 1450

## 2014-01-11 ENCOUNTER — Emergency Department (HOSPITAL_COMMUNITY)
Admission: EM | Admit: 2014-01-11 | Discharge: 2014-01-12 | Disposition: A | Discharge status: Home / Self Care | Payer: BC Managed Care – PPO | Attending: Emergency Medicine | Admitting: Emergency Medicine

## 2014-01-11 ENCOUNTER — Encounter (HOSPITAL_COMMUNITY): Payer: Self-pay | Admitting: Emergency Medicine

## 2014-01-11 DIAGNOSIS — I10 Essential (primary) hypertension: Secondary | ICD-10-CM | POA: Insufficient documentation

## 2014-01-11 DIAGNOSIS — Y929 Unspecified place or not applicable: Secondary | ICD-10-CM | POA: Insufficient documentation

## 2014-01-11 DIAGNOSIS — Z9889 Other specified postprocedural states: Secondary | ICD-10-CM | POA: Insufficient documentation

## 2014-01-11 DIAGNOSIS — K222 Esophageal obstruction: Secondary | ICD-10-CM

## 2014-01-11 DIAGNOSIS — T18108A Unspecified foreign body in esophagus causing other injury, initial encounter: Secondary | ICD-10-CM | POA: Insufficient documentation

## 2014-01-11 DIAGNOSIS — IMO0002 Reserved for concepts with insufficient information to code with codable children: Secondary | ICD-10-CM | POA: Insufficient documentation

## 2014-01-11 DIAGNOSIS — Y9389 Activity, other specified: Secondary | ICD-10-CM | POA: Insufficient documentation

## 2014-01-11 DIAGNOSIS — T18128A Food in esophagus causing other injury, initial encounter: Secondary | ICD-10-CM

## 2014-01-11 MED ORDER — GLUCAGON HCL (RDNA) 1 MG IJ SOLR
1.0000 mg | Freq: Once | INTRAMUSCULAR | Status: AC
  Administered 2014-01-11: 1 mg via INTRAMUSCULAR
  Filled 2014-01-11: qty 1

## 2014-01-11 NOTE — ED Notes (Signed)
Pt states he was eating vegetable fried rice around 8pm and feels like broccoli may be stuck in esophagus.  History of having esophagus dilated multiple times. Pt handling secretions at this time.  States he does have to spit occasionally.

## 2014-01-11 NOTE — ED Provider Notes (Signed)
CSN: [id number]     Arrival date & time 01/11/14  2255 History   First MD Initiated Contact with Patient 01/11/14 2341     Chief Complaint  Patient presents with  . Foreign Body   (Consider location/radiation/quality/duration/timing/severity/associated sxs/prior Treatment) HPI Comments: 53 year old male with a history of hypertension as well as a history of esophageal strictures, this was last dilated in 2010 by Dr. Benson Norway. He states that he has had a gradual recurrence of dysphagia and is now having difficulty with almost every meal. This requires him drinking water and trying to vomit up the food that gets stuck in his throat. Approximately 3 hours ago while eating vegetable fried rice he felt as though a stock of broccoli got stuck in his esophagus, he points to the sternal notch to the location of where he feels like the food gets [place] up. He has been unable to tolerate fluids since that time however he is tolerating secretions. Nothing makes this better or worse, no associated vomiting, hematemesis, coughing, fevers or chills  Patient is a 53 y.o. male presenting with foreign body. The history is provided by the patient and the spouse.  Foreign Body   Past Medical History  Diagnosis Date  . Hypertension    Past Surgical History  Procedure Laterality Date  . Knee surgery    . Esophageal dilation    . Carpal tunnel release     No family history on file. History  Substance Use Topics  . Smoking status: Never Smoker   . Smokeless tobacco: Not on file  . Alcohol Use: Yes    Review of Systems  All other systems reviewed and are negative.    Allergies  Review of patient's allergies indicates no known allergies.  Home Medications   Current Outpatient Rx  Name  Route  Sig  Dispense  Refill  . HYDROcodone-acetaminophen (NORCO) 10-325 MG per tablet   Oral   Take 1 tablet by mouth every 6 (six) hours as needed.         . methocarbamol (ROBAXIN) 500 MG tablet   Oral   Take  500 mg by mouth every 6 (six) hours as needed for muscle spasms.          BP 159/91  Pulse 96  Temp(Src) 98 F (36.7 C) (Oral)  Resp 18  Ht 5\' 7"  (1.702 m)  Wt 240 lb (108.863 kg)  BMI 37.58 kg/m2  SpO2 95% Physical Exam  Nursing note and vitals reviewed. Constitutional: He appears well-developed and well-nourished. No distress.  HENT:  Head: Normocephalic and atraumatic.  Mouth/Throat: Oropharynx is clear and moist. No oropharyngeal exudate.  Oropharynx is clear and moist, no bleeding, no foreign bodies  Eyes: Conjunctivae and EOM are normal. Pupils are equal, round, and reactive to light. Right eye exhibits no discharge. Left eye exhibits no discharge. No scleral icterus.  Neck: Normal range of motion. Neck supple. No JVD present. No thyromegaly present.  Cardiovascular: Normal rate, regular rhythm, normal heart sounds and intact distal pulses.  Exam reveals no gallop and no friction rub.   No murmur heard. Pulmonary/Chest: Effort normal and breath sounds normal. No respiratory distress. He has no wheezes. He has no rales.  Abdominal: Soft. Bowel sounds are normal. He exhibits no distension and no mass. There is no tenderness.  Musculoskeletal: Normal range of motion. He exhibits no edema and no tenderness.  Lymphadenopathy:    He has no cervical adenopathy.  Neurological: He is alert. Coordination normal.  Skin:  Skin is warm and dry. No rash noted. No erythema.  Psychiatric: He has a normal mood and affect. His behavior is normal.    ED Course  Procedures (including critical care time) Labs Review Labs Reviewed  CBC WITH DIFFERENTIAL - Abnormal; Notable for the following:    Eosinophils Relative 9 (*)    All other components within normal limits  BASIC METABOLIC PANEL - Abnormal; Notable for the following:    GFR calc non Af Amer 83 (*)    All other components within normal limits   Imaging Review No results found.  EKG Interpretation   None       MDM   1.  Esophageal stricture   2. Food impaction of esophagus    The patient's exam is benign however he does appear uncomfortable, will obtain labs and give glucagon prior to consultation with gastroenterology. The patient denies any bony food, or any meat this evening.  0126: The patient has had no relief after glucagon, not able to tolerate fluids, discussed with Dr. Hilarie Fredrickson who will see the patient shortly for endoscopy.  The patient was reevaluated at 2:45 AM, he has now had spontaneous passage of the food impaction and has been able to tolerate 2 cans of ginger ale. This was discussed with the gastroenterologist who will cancel the endoscopy. The patient now appears stable for discharge and followup in the outpatient setting  Meds given in ED:  Medications  glucagon (GLUCAGEN) injection 1 mg (1 mg Intramuscular Given 01/11/14 2359)    New Prescriptions   No medications on file      Johnna Acosta, MD 01/12/14 [phone]

## 2014-01-11 NOTE — ED Notes (Signed)
Patient presents stating he was eating vegetable fried rice and feels like he got some broccoli stuck in his throat.  Able to swallow his salvia

## 2014-01-12 LAB — CBC WITH DIFFERENTIAL/PLATELET
BASOS ABS: 0 10*3/uL (ref 0.0–0.1)
Basophils Relative: 1 % (ref 0–1)
EOS PCT: 9 % — AB (ref 0–5)
Eosinophils Absolute: 0.6 10*3/uL (ref 0.0–0.7)
HEMATOCRIT: 44.5 % (ref 39.0–52.0)
HEMOGLOBIN: 15.8 g/dL (ref 13.0–17.0)
LYMPHS ABS: 2 10*3/uL (ref 0.7–4.0)
LYMPHS PCT: 32 % (ref 12–46)
MCH: 28.6 pg (ref 26.0–34.0)
MCHC: 35.5 g/dL (ref 30.0–36.0)
MCV: 80.5 fL (ref 78.0–100.0)
MONO ABS: 0.4 10*3/uL (ref 0.1–1.0)
MONOS PCT: 7 % (ref 3–12)
Neutro Abs: 3.3 10*3/uL (ref 1.7–7.7)
Neutrophils Relative %: 52 % (ref 43–77)
Platelets: 196 10*3/uL (ref 150–400)
RBC: 5.53 MIL/uL (ref 4.22–5.81)
RDW: 13.4 % (ref 11.5–15.5)
WBC: 6.3 10*3/uL (ref 4.0–10.5)

## 2014-01-12 LAB — BASIC METABOLIC PANEL
BUN: 14 mg/dL (ref 6–23)
CALCIUM: 9.5 mg/dL (ref 8.4–10.5)
CHLORIDE: 100 meq/L (ref 96–112)
CO2: 25 meq/L (ref 19–32)
CREATININE: 1 mg/dL (ref 0.50–1.35)
GFR calc Af Amer: >90 mL/min (ref >90)
GFR calc non Af Amer: 83 mL/min — ABNORMAL LOW (ref >90)
GLUCOSE: 98 mg/dL (ref 70–99)
Potassium: 3.9 mEq/L (ref 3.7–5.3)
Sodium: 139 mEq/L (ref 137–147)

## 2014-01-12 NOTE — Discharge Instructions (Signed)
Please call your doctor for a followup appointment within 24-48 hours. When you talk to your doctor please let them know that you were seen in the emergency department and have them acquire all of your records so that they can discuss the findings with you and formulate a treatment plan to fully care for your new and ongoing problems. ° °

## 2014-01-12 NOTE — ED Notes (Signed)
Patient unable to keep water down.  Drank and had to vomit   Dr. Sabra Heck aware

## 2014-01-12 NOTE — ED Notes (Signed)
Pt reports no SOB, no difficulty swallowing, and is able to keep down fluids.

## 2014-07-21 ENCOUNTER — Encounter (HOSPITAL_COMMUNITY): Payer: Self-pay | Admitting: Emergency Medicine

## 2014-07-21 ENCOUNTER — Emergency Department (HOSPITAL_COMMUNITY)
Admission: EM | Admit: 2014-07-21 | Discharge: 2014-07-21 | Disposition: A | Discharge status: Home / Self Care | Payer: BC Managed Care – PPO | Attending: Emergency Medicine | Admitting: Emergency Medicine

## 2014-07-21 ENCOUNTER — Emergency Department (HOSPITAL_COMMUNITY): Payer: BC Managed Care – PPO

## 2014-07-21 DIAGNOSIS — Z79899 Other long term (current) drug therapy: Secondary | ICD-10-CM | POA: Diagnosis not present

## 2014-07-21 DIAGNOSIS — R0789 Other chest pain: Secondary | ICD-10-CM | POA: Diagnosis not present

## 2014-07-21 DIAGNOSIS — I10 Essential (primary) hypertension: Secondary | ICD-10-CM | POA: Insufficient documentation

## 2014-07-21 DIAGNOSIS — R079 Chest pain, unspecified: Secondary | ICD-10-CM | POA: Diagnosis present

## 2014-07-21 LAB — BASIC METABOLIC PANEL
ANION GAP: 9 (ref 5–15)
BUN: 13 mg/dL (ref 6–23)
CALCIUM: 9.5 mg/dL (ref 8.4–10.5)
CO2: 30 meq/L (ref 19–32)
CREATININE: 1.22 mg/dL (ref 0.50–1.35)
Chloride: 103 mEq/L (ref 96–112)
GFR calc Af Amer: 75 mL/min — ABNORMAL LOW (ref >90)
GFR, EST NON AFRICAN AMERICAN: 65 mL/min — AB (ref >90)
Glucose, Bld: 103 mg/dL — ABNORMAL HIGH (ref 70–99)
Potassium: 3.9 mEq/L (ref 3.7–5.3)
SODIUM: 142 meq/L (ref 137–147)

## 2014-07-21 LAB — CBC WITH DIFFERENTIAL/PLATELET
BASOS ABS: 0.1 10*3/uL (ref 0.0–0.1)
Basophils Relative: 1 % (ref 0–1)
EOS ABS: 0.4 10*3/uL (ref 0.0–0.7)
EOS PCT: 8 % — AB (ref 0–5)
HEMATOCRIT: 44.6 % (ref 39.0–52.0)
Hemoglobin: 15 g/dL (ref 13.0–17.0)
Lymphocytes Relative: 35 % (ref 12–46)
Lymphs Abs: 1.9 10*3/uL (ref 0.7–4.0)
MCH: 28.3 pg (ref 26.0–34.0)
MCHC: 33.6 g/dL (ref 30.0–36.0)
MCV: 84.2 fL (ref 78.0–100.0)
MONO ABS: 0.4 10*3/uL (ref 0.1–1.0)
Monocytes Relative: 7 % (ref 3–12)
Neutro Abs: 2.6 10*3/uL (ref 1.7–7.7)
Neutrophils Relative %: 49 % (ref 43–77)
PLATELETS: 185 10*3/uL (ref 150–400)
RBC: 5.3 MIL/uL (ref 4.22–5.81)
RDW: 13.6 % (ref 11.5–15.5)
WBC: 5.3 10*3/uL (ref 4.0–10.5)

## 2014-07-21 LAB — I-STAT TROPONIN, ED: Troponin i, poc: 0 ng/mL (ref 0.00–0.08)

## 2014-07-21 MED ORDER — ASPIRIN EC 325 MG PO TBEC
325.0000 mg | DELAYED_RELEASE_TABLET | Freq: Once | ORAL | Status: AC
  Administered 2014-07-21: 325 mg via ORAL
  Filled 2014-07-21: qty 1

## 2014-07-21 MED ORDER — DIAZEPAM 5 MG PO TABS
5.0000 mg | ORAL_TABLET | Freq: Once | ORAL | Status: DC
  Filled 2014-07-21: qty 1

## 2014-07-21 MED ORDER — DIAZEPAM 5 MG PO TABS: 5.0000 mg | ORAL_TABLET | Freq: Four times a day (QID) | ORAL | Status: DC | PRN

## 2014-07-21 NOTE — ED Provider Notes (Signed)
CSN: [id number]     Arrival date & time 07/21/14  1623 History   First MD Initiated Contact with Patient 07/21/14 1749     Chief Complaint  Patient presents with  . Chest Pain     (Consider location/radiation/quality/duration/timing/severity/associated sxs/prior Treatment) Patient is a 53 y.o. male presenting with chest pain.  Chest Pain Pain location:  L chest Pain quality: aching   Pain radiates to:  Does not radiate Pain radiates to the back: no   Pain severity:  Mild Onset quality:  Gradual Duration:  3 days Timing:  Constant Progression:  Unchanged Chronicity:  Recurrent Context: raising an arm   Relieved by:  Nothing Worsened by:  Movement Ineffective treatments:  None tried Associated symptoms: no abdominal pain, no back pain, no cough, no dizziness, no fever, no headache, no nausea, no shortness of breath and not vomiting     Past Medical History  Diagnosis Date  . Hypertension    Past Surgical History  Procedure Laterality Date  . Knee surgery    . Esophageal dilation    . Carpal tunnel release     No family history on file. History  Substance Use Topics  . Smoking status: Never Smoker   . Smokeless tobacco: Not on file  . Alcohol Use: Yes    Review of Systems  Constitutional: Negative for fever and chills.  HENT: Negative for congestion, rhinorrhea and sore throat.   Eyes: Negative for photophobia and visual disturbance.  Respiratory: Negative for cough and shortness of breath.   Cardiovascular: Positive for chest pain. Negative for leg swelling.  Gastrointestinal: Negative for nausea, vomiting, abdominal pain, diarrhea and constipation.  Endocrine: Negative for polydipsia and polyuria.  Genitourinary: Negative for dysuria and hematuria.  Musculoskeletal: Negative for arthralgias and back pain.  Skin: Negative for color change and rash.  Neurological: Negative for dizziness, syncope, light-headedness and headaches.  Hematological: Negative for  adenopathy. Does not bruise/bleed easily.  All other systems reviewed and are negative.     Allergies  Review of patient's allergies indicates no known allergies.  Home Medications   Prior to Admission medications   Medication Sig Start Date End Date Taking? Authorizing Provider  Azilsartan-Chlorthalidone (EDARBYCLOR) 40-12.5 MG TABS Take 1 capsule by mouth daily.   Yes Historical Provider, MD  diazepam (VALIUM) 5 MG tablet Take 1 tablet (5 mg total) by mouth every 6 (six) hours as needed for anxiety. 07/21/14   Debby Freiberg, MD   BP 128/67  Pulse 86  Temp(Src) 98.9 F (37.2 C) (Oral)  Resp 12  Ht 5\' 8"  (1.727 m)  Wt 260 lb (117.935 kg)  BMI 39.54 kg/m2  SpO2 98% Physical Exam  Vitals reviewed. Constitutional: He is oriented to person, place, and time. He appears well-developed and well-nourished.  HENT:  Head: Normocephalic and atraumatic.  Eyes: Conjunctivae and EOM are normal.  Neck: Normal range of motion. Neck supple.  Cardiovascular: Normal rate, regular rhythm and normal heart sounds.   Pulmonary/Chest: Effort normal and breath sounds normal. No respiratory distress. He exhibits tenderness.  Abdominal: He exhibits no distension. There is no tenderness. There is no rebound and no guarding.  Musculoskeletal: Normal range of motion.  Neurological: He is alert and oriented to person, place, and time.  Skin: Skin is warm and dry.    ED Course  Procedures (including critical care time) Labs Review Labs Reviewed  CBC WITH DIFFERENTIAL - Abnormal; Notable for the following:    Eosinophils Relative 8 (*)  All other components within normal limits  BASIC METABOLIC PANEL - Abnormal; Notable for the following:    Glucose, Bld 103 (*)    GFR calc non Af Amer 65 (*)    GFR calc Af Amer 75 (*)    All other components within normal limits  Randolm Idol, ED    Imaging Review Dg Chest 2 View  07/21/2014   CLINICAL DATA:  Left-sided chest pain.  EXAM: CHEST  2 VIEW   COMPARISON:  04/22/2008.  FINDINGS: The heart size and mediastinal contours are within normal limits. Both lungs are clear. The visualized skeletal structures are unremarkable.  IMPRESSION: Normal chest x-ray.   Electronically Signed   By: Kalman Jewels M.D.   On: 07/21/2014 19:33     EKG Interpretation None      MDM   Final diagnoses:  Musculoskeletal chest pain    53 y.o. male  with pertinent PMH of prior chest pain, HTN presents with L sided chest wall pain as described above.  Physical exam with reproduction of pain on palpation and with pt arm movement.  HEART 2.  Pain present at maximum since awakening this am, nonexertional.  Single trop sufficient given duration.  Given constellation of history and exam, consider MSK likely, but will have pt fu with cardiology.  Standard return precautions given.      Labs and imaging as above reviewed.   1. Musculoskeletal chest pain         Debby Freiberg, MD 07/22/14 1140

## 2014-07-21 NOTE — Discharge Instructions (Signed)

## 2014-07-21 NOTE — ED Notes (Signed)
Pt c/o pain to left and right side of chest x 3 days. Pt denies any other symptoms or injury.

## 2014-07-21 NOTE — ED Notes (Signed)
NAD at this time.  

## 2014-09-09 DIAGNOSIS — C61 Malignant neoplasm of prostate: Secondary | ICD-10-CM

## 2014-09-09 HISTORY — DX: Malignant neoplasm of prostate: C61

## 2014-09-09 HISTORY — PX: PROSTATE BIOPSY: SHX241

## 2014-10-22 ENCOUNTER — Ambulatory Visit: Payer: BC Managed Care – PPO | Admitting: Radiation Oncology

## 2014-10-22 ENCOUNTER — Ambulatory Visit: Payer: BC Managed Care – PPO

## 2014-11-08 ENCOUNTER — Encounter: Payer: Self-pay | Admitting: *Deleted

## 2014-11-12 ENCOUNTER — Encounter: Payer: Self-pay | Admitting: Radiation Oncology

## 2014-11-12 NOTE — Progress Notes (Signed)
GU Location of Tumor / Histology: prostate adenocarcinoma  If Prostate Cancer, Gleason Score is (3 + 3) and PSA is (3.92 on 05/29/14) June 2015 PSA 3.92 May 2015 PSA 3.7  Yousif Edelson presented 3 months ago with signs/symptoms of: rising PSA  Biopsies of prostate revealed:  09/09/14 volume 42.22 cc    Past/Anticipated interventions by urology, if any: biopsy  Past/Anticipated interventions by medical oncology, if any: no  Weight changes, if any: no  Bowel/Bladder complaints, if any:  IPSS 2  Nausea/Vomiting, if any: no  Pain issues, if any:  no  SAFETY ISSUES:  Prior radiation? no  Pacemaker/ICD? no  Possible current pregnancy? no  Is the patient on methotrexate? no  Current Complaints / other details:  Married, works for [employer], 2 sons, 3 daughters. Wife is Therapist, sports, works Chief Executive Officer schedule at [employer]. Patient considering seed implant.

## 2014-11-13 ENCOUNTER — Encounter: Payer: Self-pay | Admitting: Radiation Oncology

## 2014-11-13 ENCOUNTER — Ambulatory Visit
Admission: RE | Admit: 2014-11-13 | Discharge: 2014-11-13 | Disposition: A | Discharge status: Home / Self Care | Payer: BC Managed Care – PPO | Source: Ambulatory Visit | Attending: Radiation Oncology | Admitting: Radiation Oncology

## 2014-11-13 VITALS — BP 137/72 | HR 86 | Temp 98.6°F | Resp 20 | Ht 68.0 in | Wt 280.0 lb

## 2014-11-13 DIAGNOSIS — C61 Malignant neoplasm of prostate: Secondary | ICD-10-CM | POA: Insufficient documentation

## 2014-11-13 NOTE — Progress Notes (Signed)
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 male who is seen today through the courtesy of Dr. Janice Norrie for evaluation of his stage TIc favorable risk adenocarcinoma prostate.  He was noted to have an elevated  PSA of 3.7 this past May by his primary care physician, Dr. Glendale Chard.  He was seen by Dr. Janice Norrie and a repeat PSA on 05/29/2014 was 3.92 with 16% free PSA.  Ultrasound-guided biopsies on 09/09/2014.  He was found to have Gleason 6 (3+3) in less than 5% of one core from the right mid gland, 5% of one core from the left base, 5% of one core from the left lateral mid gland, 5% of one core from the left mid gland and 5% of one core from left apex.  He had multiple other areas for atypia.  His gland volume was approximately 42 mL.  He is doing well from a GU and GI standpoint.  His I PSS score is 2.  He is potent.  PREVIOUS RADIATION THERAPY: No   PAST MEDICAL HISTORY:  has a past medical history of Hypertension and Adenocarcinoma of prostate (09/09/14).     PAST SURGICAL HISTORY:  Past Surgical History  Procedure Laterality Date  . Knee surgery    . Esophageal dilation    . Carpal tunnel release    . Prostate biopsy  09/09/14    Gleason 6, vol 42.22 cc     FAMILY HISTORY: family history includes Alzheimer's disease in his father; Cancer in his brother and father; Dementia in his mother; Pancreatic cancer in his sister.    His father died in his 80s was Alzheimer's disease.  His mother died in her 16s from dementia.  He believes that his father was diagnosed with prostate cancer in his 72s.   SOCIAL HISTORY:  reports that he has never smoked. He does  not have any smokeless tobacco history on file. He reports that he drinks alcohol. He reports that he does not use illicit drugs. Married, 5 children.  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   ALLERGIES: Review of patient's allergies indicates no known allergies.   MEDICATIONS:  Current Outpatient Prescriptions  Medication Sig Dispense Refill  . olmesartan (BENICAR) 20 MG tablet Take 20 mg by mouth daily.     No current facility-administered medications for this encounter.     REVIEW OF SYSTEMS:  Pertinent items are noted in HPI.    PHYSICAL EXAM:  height is 5\' 8"  (1.727 m) and weight is 280 lb (127.007 kg). His oral temperature is 98.6 F (37 C). His blood pressure is 137/72 and his pulse is 86. His respiration is 20.   Alert and oriented.  Head and neck examination: Grossly unremarkable.  Nodes: Without palpable cervical or supraclavicular lymphadenopathy.  Chest: Lungs clear.  Abdomen:.  Umbilical scar from her surgery.  Without masses organomegaly.  Genitalia unremarkable to inspection.  Rectal: I am only able to palpate the mid and inferior gland which is without induration or nodularity.  Extremities: Without edema.   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  I explained to the patient and his wife that his prognosis is related to his stage, Gleason score, and PSA level.  All are favorable.  We discussed robotic surgery versus active surveillance versus radiation therapy.  In view of his age he is not a good candidate for active surveillance.  We discussed seed implantation versus external beam/IMRT.  We discussed the  potential acute and late toxicities of radiation therapy.  We discussed bladder filling for IMRT to minimize urinary toxicity.  Considering his age, I certainly favor robotic prostatectomy.  He is now willing to meet with one of the robotic surgeons, and I told him that this could be arranged by Dr. Janice Norrie.   PLAN: As discussed above.  I asked that Dr. Janice Norrie schedule consultation with one of the robotic surgeons.  The patient can contact me should he decline surgery.  Of the 2 radiation therapy options, he would be more interested in external beam/IMRT. I spent 60 minutes face to face with the patient and more than 50% of that time was spent in counseling and/or coordination of care.

## 2014-11-13 NOTE — Progress Notes (Signed)
Please see the Nurse Progress Note in the MD Initial Consult Encounter for this patient. 

## 2015-02-24 ENCOUNTER — Encounter: Payer: Self-pay | Admitting: Neurology

## 2015-02-24 ENCOUNTER — Ambulatory Visit (INDEPENDENT_AMBULATORY_CARE_PROVIDER_SITE_OTHER): Payer: BLUE CROSS/BLUE SHIELD | Admitting: Neurology

## 2015-02-24 VITALS — BP 139/93 | HR 88 | Temp 98.8°F | Resp 20 | Ht 68.0 in | Wt 270.0 lb

## 2015-02-24 DIAGNOSIS — R519 Headache, unspecified: Secondary | ICD-10-CM

## 2015-02-24 DIAGNOSIS — R51 Headache: Secondary | ICD-10-CM

## 2015-02-24 DIAGNOSIS — R351 Nocturia: Secondary | ICD-10-CM

## 2015-02-24 DIAGNOSIS — G4733 Obstructive sleep apnea (adult) (pediatric): Secondary | ICD-10-CM | POA: Diagnosis not present

## 2015-02-24 NOTE — Progress Notes (Signed)
Subjective:    Patient ID: Logan Contreras is a 54 y.o. male.  HPI     Star Age, MD, PhD [facility] [address], [address] [place], [place] [address]  Dear Logan Contreras Has,   I saw your patient, Logan Contreras upon your kind request in my neurologic clinic today for initial consultation of his sleep disturbance, in particular, concern for underlying obstructive sleep apnea. The patient is unaccompanied today. As you know, Logan Contreras is a 54 year old right-handed gentleman with an underlying medical history of prostate cancer, hypertension and obesity, who reports excessive daytime somnolence in the context of snoring, witnessed apneic pauses while asleep, and waking up with a sense of gasping and choking. In fact, he was diagnosed with severe sleep apnea in 2007. I reviewed his CPAP titration study from 04/20/2011, read by Dr. Baird Lyons. He was titrated on CPAP of 20 cm of water pressure and prescribe CPAP treatment at the time. He said he was compliant with treatment for a few years but then the machine broke and he lost insurance as well. His baseline sleep study from June 2007 reportedly showed an AHI of 65 per hour.  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 He goes to work at 1. He has a bedtime around 10 PM and falls asleep quickly but does not stay asleep well. He does not wake up rested. He often wakes up with a global headache in the morning. He has to get up to use the bathroom twice per night on average. He denies restless leg symptoms but has left knee pain. He had 3 arthroscopic surgeries to the left knee. It still bothers him from time to time. His rise time is 5 AM. He has a 49 year old daughter and his wife at the house. He has a total of 5 children, the older for are grown. He has 1 grandson. He drinks coffee 1 or 2  cups in the morning. He does not drink alcohol regularly. He quit smoking over 10 years ago. At the time of his previous sleep study in 2007 he weighed 240 pounds. He gained quite a bit of weight but is trying to lose weight and has lost about 10 pounds since December 2015. He's not aware of any family history of obstructive sleep apnea. He denies any parasomnias. His Epworth sleepiness score is 22 out of 24 today.  His Past Medical History Is Significant For: Past Medical History  Diagnosis Date  . Hypertension   . 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 Morbid obesity   . Sleep apnea     His Past Surgical History Is Significant For: Past Surgical History  Procedure Laterality Date  . Knee surgery    . Esophageal dilation    . Carpal tunnel release    . Prostate biopsy  09/09/14    Gleason 6, vol 42.22 cc    His Family History Is Significant For: Family History  Problem Relation Age of Onset  . Dementia Mother   . Alzheimer's disease Father   . Cancer Father     prostate, surgery  . Cancer Brother     stomach  . Pancreatic cancer Sister     His Social History Is Significant For: History   Social History  . Marital Status: Married  Spouse Name: N/A  . Number of Children: 5  . Years of Education: N/A   Occupational History  .  [employer]   Social History Main Topics  . Smoking status: Never Smoker   . Smokeless tobacco: Not on file     Comment: smoked a little in high school  . Alcohol Use: No     Comment: socially  . Drug Use: No  . Sexual Activity: Not on file   Other Topics Concern  . None   Social History Narrative    His Allergies Are:  No Known Allergies:   His Current Medications Are:  Outpatient Encounter Prescriptions as of 02/24/2015  Medication Sig  . BENICAR HCT 40-25 MG per tablet Take 1 tablet by mouth daily.  . [DISCONTINUED] Azilsartan-Chlorthalidone 40-12.5 MG TABS Take 1 tablet by mouth 1 day or 1 dose.  .  [DISCONTINUED] cetirizine (ZYRTEC) 10 MG tablet Take 10 mg by mouth daily.  . [DISCONTINUED] olmesartan (BENICAR) 20 MG tablet Take 20 mg by mouth daily.  :  Review of Systems:  Out of a complete 14 point review of systems, all are reviewed and negative with the exception of these symptoms as listed below:   Review of Systems  Constitutional:       Weight gain   HENT: Positive for trouble swallowing.   Respiratory: Positive for cough and shortness of breath.        Snoring   Gastrointestinal: Positive for blood in stool.  Neurological:       Does not wake well rested, Feels like he cannot breath during the night. Hx of CPAP use.     Objective:  Neurologic Exam  Physical Exam Physical Examination:   Filed Vitals:   02/24/15 0849  BP: 139/93  Pulse: 88  Temp: 98.8 F (37.1 C)  Resp: 20    General Examination: The patient is a very pleasant 54 y.o. male in no acute distress. He appears well-developed and well-nourished and well groomed.   HEENT: Normocephalic, atraumatic, pupils are equal, round and reactive to light and accommodation. Funduscopic exam is normal with sharp disc margins noted. Extraocular tracking is good without limitation to gaze excursion or nystagmus noted. Normal smooth pursuit is noted. Hearing is grossly intact. Tympanic membranes are clear bilaterally. Face is symmetric with normal facial animation and normal facial sensation. Speech is clear with no dysarthria noted. There is no hypophonia. There is no lip, neck/head, jaw or voice tremor. Neck is supple with full range of passive and active motion. There are no carotid bruits on auscultation. Oropharynx exam reveals: mild mouth dryness, adequate dental hygiene and severe airway crowding, due to large tongue and redundant soft palate and thick soft palate. Uvula was not fully visualized and tonsils were not fully visualized. He also has a sensitive gag reflex. Mallampati is class III. Neck size is 18-5/8  inches. He has no overbite. Nasal inspection is fairly unremarkable with no significant nasal mucosal bogginess or redness and no septal deviation.   Chest: Clear to auscultation without wheezing, rhonchi or crackles noted.  Heart: S1+S2+0, regular and normal without murmurs, rubs or gallops noted.   Abdomen: Soft, non-tender and non-distended with normal bowel sounds appreciated on auscultation.  Extremities: There is no pitting edema in the distal lower extremities bilaterally. Pedal pulses are intact.  Skin: Warm and dry without trophic changes noted. There are no varicose veins.  Musculoskeletal: exam reveals no obvious joint deformities, tenderness or joint swelling or erythema.   Neurologically:  Mental status: The patient is awake, alert and oriented in all 4 spheres. His immediate and remote memory, attention, language skills and fund of knowledge are appropriate. There is no evidence of aphasia, agnosia, apraxia or anomia. Speech is clear with normal prosody and enunciation. Thought process is linear. Mood is normal and affect is normal.  Cranial nerves II - XII are as described above under HEENT exam. In addition: shoulder shrug is normal with equal shoulder height noted. Motor exam: Normal bulk, strength and tone is noted. There is no drift, tremor or rebound. Romberg is negative. Reflexes are 2+ throughout. Babinski: Toes are flexor bilaterally. Fine motor skills and coordination: intact with normal finger taps, normal hand movements, normal rapid alternating patting, normal foot taps and normal foot agility.  Cerebellar testing: No dysmetria or intention tremor on finger to nose testing. Heel to shin is slightly difficult bilaterally, secondary to body habitus. There is no truncal or gait ataxia.  Sensory exam: intact to light touch, pinprick, vibration, temperature sense in the upper and lower extremities.  Gait, station and balance: He stands easily. No veering to one side is  noted. No leaning to one side is noted. Posture is age-appropriate and stance is narrow based. Gait shows normal stride length and normal pace. No problems turning are noted. He turns en bloc. Tandem walk is unremarkable.                Assessment and Plan:  In summary, Lily Kernen is a very pleasant 54 y.o.-year old male  with an underlying medical history of prostate cancer, hypertension and obesity, who was previously diagnosed with severe obstructive sleep apnea and was placed on CPAP therapy in 2008. Unfortunately, he has gained weight. He also lost insurance for a while and was not able to maintain CPAP treatment. He presents for reevaluation of his obstructive sleep apnea in the context of severe daytime somnolence. I had a long chat with the patient and was placed on CPAP therapy. about my findings and the diagnosis of OSA, its prognosis and treatment options. We talked about medical treatments, surgical interventions and non-pharmacological approaches. I explained in particular the risks and ramifications of untreated moderate to severe OSA, especially with respect to developing cardiovascular disease down the Road, including congestive heart failure, difficult to treat hypertension, cardiac arrhythmias, or stroke. Even type 2 diabetes has, in part, been linked to untreated OSA. Symptoms of untreated OSA include daytime sleepiness, memory problems, mood irritability and mood disorder such as depression and anxiety, lack of energy, as well as recurrent headaches, especially morning headaches. We talked about trying to maintain a healthy lifestyle in general, as well as the importance of weight control. I encouraged the patient to eat healthy, exercise daily and keep well hydrated, to keep a scheduled bedtime and wake time routine, to not skip any meals and eat healthy snacks in between meals. I advised the patient not to drive when feeling sleepy. I recommended the following at this time: sleep  study with potential positive airway pressure titration. (We will score hypopneas at 3% and split the sleep study into diagnostic and treatment portion, if the estimated. 2 hour AHI is >15/h).   I explained the sleep test procedure to the patient and also outlined possible surgical and non-surgical treatment options of OSA, including the use of a custom-made dental device (which would require a referral to a specialist dentist or oral surgeon), upper airway surgical options, such as pillar implants, radiofrequency surgery, tongue base  surgery, and UPPP (which would involve a referral to an ENT surgeon). Rarely, jaw surgery such as mandibular advancement may be considered.  I also explained the CPAP treatment option to the patient, who indicated that he would be willing to try CPAP if the need arises. I explained the importance of being compliant with PAP treatment, not only for insurance purposes but primarily to improve His symptoms, and for the patient's long term health benefit, including to reduce His cardiovascular risks. I answered all his questions today and the patient was in agreement. I would like to see him back after the sleep study is completed and encouraged him to call with any interim questions, concerns, problems or updates.   Thank you very much for allowing me to participate in the care of this nice patient. If I can be of any further assistance to you please do not hesitate to call me at [phone].  Sincerely,   Star Age, MD, PhD

## 2015-02-24 NOTE — Patient Instructions (Signed)

## 2015-03-16 ENCOUNTER — Ambulatory Visit (INDEPENDENT_AMBULATORY_CARE_PROVIDER_SITE_OTHER): Payer: BLUE CROSS/BLUE SHIELD | Admitting: Neurology

## 2015-03-16 VITALS — BP 148/88

## 2015-03-16 DIAGNOSIS — R351 Nocturia: Secondary | ICD-10-CM

## 2015-03-16 DIAGNOSIS — G4733 Obstructive sleep apnea (adult) (pediatric): Secondary | ICD-10-CM

## 2015-03-16 DIAGNOSIS — G4761 Periodic limb movement disorder: Secondary | ICD-10-CM

## 2015-03-16 DIAGNOSIS — R51 Headache: Secondary | ICD-10-CM

## 2015-03-16 DIAGNOSIS — R519 Headache, unspecified: Secondary | ICD-10-CM

## 2015-03-16 NOTE — Sleep Study (Signed)
Please see the scanned sleep study interpretation located in the Procedure tab within the Chart Review section. 

## 2015-03-26 ENCOUNTER — Telehealth: Payer: Self-pay | Admitting: Neurology

## 2015-03-26 DIAGNOSIS — G4733 Obstructive sleep apnea (adult) (pediatric): Secondary | ICD-10-CM

## 2015-03-26 NOTE — Telephone Encounter (Signed)
Beverlee Nims:   Please call and notify patient that the recent sleep study confirmed the diagnosis of OSA. He did very well with CPAP during the study with significant improvement of the respiratory events. Therefore, I would like start the patient on CPAP at home. I placed the order in the chart. The patient will need a follow up appointment with me in 8 to 10 weeks post set up that has to be scheduled; please go ahead and schedule while you have the patient on the phone and make sure patient understands the importance of keeping this window for the FU appointment, as it is often an insurance requirement and failing to adhere to this may result in losing coverage for sleep apnea treatment. 15 min follow-up should suffice, unless there is a 30 min FU slot available.  Please re-enforce the importance of compliance with treatment and the need for [place] to monitor compliance data - again an insurance requirement.  Also remind patient, that any upcoming CPAP machine or mask issues, should be first addressed with the DME company. Please ask if patient has a preference regarding DME company and note on phone note for Shanon Brow. Route ph note to Shanon Brow.   David:   Please arrange for CPAP set up at home through a DME company of patient's choice - once Beverlee Nims has spoken to patient - and fax/route report to PCP and referring MD (if other than PCP).    Once Beverlee Nims has spoken to patient and scheduled the FU appt (or the new patient consultation) and Shanon Brow has sent off DME order and scanned the paperwork, Shanon Brow can close this encounter, thanks,   Star Age, MD, PhD [facility] ([place])

## 2015-03-26 NOTE — Telephone Encounter (Signed)
Left message on home and cell to call back for sleep study results.

## 2015-03-26 NOTE — Telephone Encounter (Signed)
Spoke to [place] and gave results of sleep study. Pt wishes to proceed with CPAP. Explained to pt that he must come back to clinic between 30-60 days from CPAP starting per insurance requirements. Follow up appt made.

## 2015-03-27 ENCOUNTER — Encounter: Payer: Self-pay | Admitting: Neurology

## 2015-03-27 NOTE — Telephone Encounter (Signed)
Patient was contacted and referred to [facility] for CPAP set up.  Patient is aware of his scheduled follow up appointment.  Dr. Glendale Chard was routed a copy of the report.

## 2015-05-19 ENCOUNTER — Ambulatory Visit (INDEPENDENT_AMBULATORY_CARE_PROVIDER_SITE_OTHER): Payer: BLUE CROSS/BLUE SHIELD | Admitting: Neurology

## 2015-05-19 ENCOUNTER — Encounter: Payer: Self-pay | Admitting: Neurology

## 2015-05-19 VITALS — BP 131/81 | HR 87 | Ht 68.0 in | Wt 273.5 lb

## 2015-05-19 DIAGNOSIS — G4733 Obstructive sleep apnea (adult) (pediatric): Secondary | ICD-10-CM

## 2015-05-19 NOTE — Progress Notes (Signed)
Subjective:    Patient ID: Logan Contreras is a 54 y.o. male.  HPI     Interim history:   Logan Contreras is a 54 year old right-handed gentleman with an underlying medical history of prostate cancer, hypertension and obesity, who presents for follow-up consultation of his obstructive sleep apnea, after his recent sleep study. The patient is unaccompanied today. I first met him on 02/24/2015 at the request of his primary care provider, at which time he reported snoring, apneic pauses while asleep, excessive daytime somnolence and a prior diagnosis of severe obstructive sleep apnea. He had lost insurance and also his CPAP machine broke years ago. I invited him back for a sleep study. He had a split-night sleep study on 03/16/2015 and I went over his test results with him in detail today. His baseline sleep efficiency was 78.8% with a latency to sleep of 11 minutes and wake after sleep onset of 8 minutes with mild sleep fragmentation noted. He had a markedly elevated arousal index. He had absence of slow-wave and absence of REM sleep. He had no significant PLMS. He had no significant EKG changes. He had moderate to loud snoring. He had a total AHI of 137.9 per hour. Average oxygen saturation was 90%, nadir was 79%. He was then titrated with CPAP. Sleep efficiency was 96.5%. Arousal index was significantly improved. There was absence of slow-wave sleep but increased percentage of REM sleep. Average oxygen saturation was improved at 94%, nadir was 82%. Mild PLMS were noted at 7 per hour with no significant arousals. CPAP was titrated from 5-12 cm. AHI was 3.7 per hour at the final pressure with supine REM sleep achieved. He indicated that he slept better than usual. Based on the test results I prescribed CPAP therapy for home use.  Today, 05/19/2015: He reports, that he is not yet on CPAP. Of note, we faxed his CPAP order in April to the [place]. We did get in touch with the DME company and they  stated that they finally did get in touch with the patient and he said he was not able to afford CPAP therapy and that he would call back. He never called back. I talked to the patient about this and he is willing to get set up with CPAP therapy at home at this time. I explained all the study findings in detail to him. I answered all his questions. He is trying to lose weight. He is trying to drink more water and does not drink sodas.  Previously:  He was diagnosed with severe sleep apnea in 2007. I reviewed his CPAP titration study from 04/20/2011, read by Dr. Baird Lyons. He was titrated on CPAP of 20 cm of water pressure and prescribed CPAP treatment at the time. He said he was compliant with treatment for a few years but then the machine broke and he lost insurance as well. His baseline sleep study from June 2007 reportedly showed an AHI of 65 per hour.   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 He goes to work at 11. He has a bedtime around 10 PM and falls asleep quickly but does not stay asleep well. He does not wake up rested. He often wakes up with a global headache in the morning. He has to get up to use the bathroom twice per night on average. He denies restless  leg symptoms but has left knee pain. He had 3 arthroscopic surgeries to the left knee. It still bothers him from time to time. His rise time is 5 AM. He has a 72 year old daughter and his wife at the house. He has a total of 5 children, the older for are grown. He has 1 grandson. He drinks coffee 1 or 2 cups in the morning. He does not drink alcohol regularly. He quit smoking over 10 years ago. At the time of his previous sleep study in 2007 he weighed 240 pounds. He gained quite a bit of weight but is trying to lose weight and has lost about 10 pounds since December 2015. He's not aware of any family history  of obstructive sleep apnea. He denies any parasomnias. His Epworth sleepiness score is 22 out of 24 today.   His Past Medical History Is Significant For: Past Medical History  Diagnosis Date  . Hypertension   . 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 Morbid obesity   . Sleep apnea     His Past Surgical History Is Significant For: Past Surgical History  Procedure Laterality Date  . Knee surgery    . Esophageal dilation    . Carpal tunnel release    . Prostate biopsy  09/09/14    Gleason 6, vol 42.22 cc    His Family History Is Significant For: Family History  Problem Relation Age of Onset  . Dementia Mother   . Alzheimer's disease Father   . Cancer Father     prostate, surgery  . Cancer Brother     stomach  . Pancreatic cancer Sister     His Social History Is Significant For: History   Social History  . Marital Status: Married    Spouse Name: N/A  . Number of Children: 5  . Years of Education: N/A   Occupational History  .  [employer]   Social History Main Topics  . Smoking status: Never Smoker   . Smokeless tobacco: Never Used     Comment: smoked a little in high school  . Alcohol Use: 0.0 oz/week    0 Standard drinks or equivalent per week     Comment: socially  . Drug Use: No  . Sexual Activity: Not on file   Other Topics Concern  . None   Social History Narrative    His Allergies Are:  No Known Allergies:    His Current Medications Are:  Outpatient Encounter Prescriptions as of 05/19/2015  Medication Sig  . BENICAR HCT 40-25 MG per tablet Take 1 tablet by mouth daily.   No facility-administered encounter medications on file as of 05/19/2015.  :  Review of Systems:  Out of a complete 14 point review of systems, all are reviewed and negative with the exception of these symptoms as listed below:  Review of Systems  Respiratory: Positive for cough.   Genitourinary:       Prostate problems  Allergic/Immunologic:        Seasonal allergies, coughing, bronchitis  Neurological:       Snoring, stop breathing during sleep     Objective:  Neurologic Exam  Physical Exam Physical Examination:   Filed Vitals:   05/19/15 1133  BP: 131/81  Pulse: 87   General Examination: The patient is a very pleasant 54 y.o. male in no acute distress. He appears well-developed and well-nourished and well groomed.   HEENT: Normocephalic, atraumatic, pupils are equal, round and  reactive to light and accommodation. Funduscopic exam is normal with sharp disc margins noted. Extraocular tracking is good without limitation to gaze excursion or nystagmus noted. Normal smooth pursuit is noted. Hearing is grossly intact. Face is symmetric with normal facial animation and normal facial sensation. Speech is clear with no dysarthria noted. There is no hypophonia. There is no lip, neck/head, jaw or voice tremor. Neck is supple with full range of passive and active motion. There are no carotid bruits on auscultation. Oropharynx exam reveals: mild mouth dryness, adequate dental hygiene and severe airway crowding, due to large tongue and redundant soft palate and thick soft palate. Uvula was not fully visualized and tonsils were not fully visualized. He also has a sensitive gag reflex. Mallampati is class III. He has no overbite.   Chest: Clear to auscultation without wheezing, rhonchi or crackles noted.  Heart: S1+S2+0, regular and normal without murmurs, rubs or gallops noted.   Abdomen: Soft, non-tender and non-distended with normal bowel sounds appreciated on auscultation.  Extremities: There is no pitting edema in the distal lower extremities bilaterally. Pedal pulses are intact.  Skin: Warm and dry without trophic changes noted. There are no varicose veins.  Musculoskeletal: exam reveals no obvious joint deformities, tenderness or joint swelling or erythema.   Neurologically:  Mental status: The patient is awake, alert and oriented in  all 4 spheres. His immediate and remote memory, attention, language skills and fund of knowledge are appropriate. There is no evidence of aphasia, agnosia, apraxia or anomia. Speech is clear with normal prosody and enunciation. Thought process is linear. Mood is normal and affect is normal.  Cranial nerves II - XII are as described above under HEENT exam. In addition: shoulder shrug is normal with equal shoulder height noted. Motor exam: Normal bulk, strength and tone is noted. There is no drift, tremor or rebound. Romberg is negative. Reflexes are 2+ throughout. Fine motor skills and coordination: intac.  Sensory exam: intact to light touch.  Gait, station and balance: He stands easily. No veering to one side is noted. No leaning to one side is noted. Posture is age-appropriate and stance is narrow based. Gait shows normal stride length and normal pace. No problems turning are noted. He turns en bloc.         Assessment and Plan:  In summary, Artemis Loyal is a very pleasant 54 year old male with an underlying medical history of prostate cancer, hypertension and obesity, who was previously diagnosed with severe obstructive sleep apnea and was placed on CPAP therapy in 2008. Unfortunately, he gained weight in the interim and has not been on CPAP therapy for the past 3 years. We talked about his split-night sleep study results from April 2016. This confirmed severe obstructive sleep apnea. He did well with CPAP therapy and I prescribed home CPAP therapy for him but he was not set up with it as he reported difficulty carrying the cost of the co-pays. Nevertheless, at this juncture, he is agreeable to starting CPAP therapy. We will refax the order in the sleep study results to the DME company. He is again advised to be fully compliant with CPAP therapy. He is advised regarding the risks and ramifications of untreated moderate to severe OSA, especially with respect to developing cardiovascular disease down the  Road, including congestive heart failure, difficult to treat hypertension, cardiac arrhythmias, or stroke. Even type 2 diabetes has, in part, been linked to untreated OSA. Symptoms of untreated OSA include daytime sleepiness, memory problems,  mood irritability and mood disorder such as depression and anxiety, lack of energy, as well as recurrent headaches, especially morning headaches. We talked about trying to maintain a healthy lifestyle in general, as well as the importance of weight control. I encouraged the patient to eat healthy, exercise daily and keep well hydrated, to keep a scheduled bedtime and wake time routine, to not skip any meals and eat healthy snacks in between meals. I advised the patient not to drive when feeling sleepy. I recommended the following at this time: start CPAP therapy ASAP. Follow up in about 10 weeks.  I again the importance of being compliant with PAP treatment, not only for insurance purposes but primarily to improve His symptoms, and for the patient's long term health benefit, including to reduce His cardiovascular risks. I answered all his questions today and the patient was in agreement. I encouraged him to call with any interim questions, concerns, problems or updates.   I spent 15 minutes in total face-to-face time with the patient, more than 50% of which was spent in counseling and coordination of care, reviewing test results, reviewing medication and discussing or reviewing the diagnosis of OSA, its prognosis and treatment options.

## 2015-05-19 NOTE — Patient Instructions (Addendum)
We will get you set up with CPAP at home. I had made a referral in April as you recall and [facility] did get in touch with you. Please arrange for a CPAP set up ASAP.  Follow up with me in about 10 weeks.   Please start using CPAP regularly. While your insurance requires that you use CPAP at least 4 hours each night on 70% of the nights, I recommend, that you not skip any nights and use it throughout the night if you can. Getting used to CPAP and staying with the treatment long term does take time and patience and discipline. Untreated obstructive sleep apnea when it is moderate to severe can have an adverse impact on cardiovascular health and raise her risk for heart disease, arrhythmias, hypertension, congestive heart failure, stroke and diabetes. Untreated obstructive sleep apnea causes sleep disruption, nonrestorative sleep, and sleep deprivation. This can have an impact on your day to day functioning and cause daytime sleepiness and impairment of cognitive function, memory loss, mood disturbance, and problems focussing. Using CPAP regularly can improve these symptoms.

## 2015-07-21 ENCOUNTER — Telehealth: Payer: Self-pay

## 2015-07-21 ENCOUNTER — Ambulatory Visit: Payer: Self-pay | Admitting: Neurology

## 2015-07-21 NOTE — Telephone Encounter (Signed)
I spoke to patient. He missed today's appt. I was able to reschedule in September

## 2015-07-21 NOTE — Telephone Encounter (Signed)
Patient did not come to appt.

## 2015-07-29 ENCOUNTER — Encounter: Payer: Self-pay | Admitting: Neurology

## 2015-08-18 ENCOUNTER — Telehealth: Payer: Self-pay

## 2015-08-18 ENCOUNTER — Ambulatory Visit: Payer: Self-pay | Admitting: Neurology

## 2015-08-18 NOTE — Telephone Encounter (Signed)
Patient did not show to appt today  

## 2015-08-19 ENCOUNTER — Encounter: Payer: Self-pay | Admitting: Neurology

## 2015-08-26 ENCOUNTER — Ambulatory Visit: Payer: Self-pay

## 2015-08-26 ENCOUNTER — Telehealth: Payer: Self-pay | Admitting: *Deleted

## 2015-08-26 ENCOUNTER — Ambulatory Visit: Admission: RE | Admit: 2015-08-26 | Payer: Self-pay | Source: Ambulatory Visit | Admitting: Radiation Oncology

## 2015-09-09 ENCOUNTER — Ambulatory Visit
Admission: RE | Admit: 2015-09-09 | Discharge: 2015-09-09 | Disposition: A | Discharge status: Home / Self Care | Payer: BLUE CROSS/BLUE SHIELD | Source: Ambulatory Visit | Attending: Radiation Oncology | Admitting: Radiation Oncology

## 2015-09-09 ENCOUNTER — Encounter: Payer: Self-pay | Admitting: Radiation Oncology

## 2015-09-09 VITALS — BP 130/65 | HR 75 | Temp 97.8°F | Ht 68.0 in | Wt 267.8 lb

## 2015-09-09 DIAGNOSIS — C61 Malignant neoplasm of prostate: Secondary | ICD-10-CM | POA: Insufficient documentation

## 2015-09-09 NOTE — Addendum Note (Signed)
Encounter addended by: Arloa Koh, MD on: 09/09/2015  5:52 PM<BR>     Documentation filed: Follow-up Section, LOS Section

## 2015-09-09 NOTE — Progress Notes (Signed)
CC: Dr. Dutch Gray, Dr. Glendale Chard  Follow-up note:  Logan Contreras visits today for follow-up of his stage TIc favorable risk adenocarcinoma prostate.  I saw the patient consultation on 11/13/2014.  He was initially referred to me by Dr. Janice Norrie.  He was noted to have an elevated PSA of 3.7 in May 2015  through his primary care physician, Dr. Glendale Chard. He was seen by Dr. Janice Norrie and a repeat PSA on 05/29/2014 was 3.92 with 16% free PSA. He had ultrasound-guided biopsies on 09/09/2014. He was found to have Gleason 6 (3+3) in less than 5% of one core from the right mid gland, 5% of one core from the left base, 5% of one core from the left lateral mid gland, 5% of one core from the left mid gland and 5% of one core from left apex. He had multiple other areas for atypia. His gland volume was approximately 42 mL.  His I PSS score that time was 2, and his I PSS score today is 4.  He is potent.  No GI difficulties.  He was supposed to follow-up with Dr. Janice Norrie and see one of robotic surgeons for discussion of robotic surgery which was my recommendation.  He never followed up with [facility].  He seen today for discussion of possible management options.  He has not had a recent PSA.  Physical examination: Somewhat anxious 54 year old African-American male appearing younger than his stated age. Filed Vitals:   09/09/15 1608  BP: 130/65  Pulse: 75  Temp: 97.8 F (36.6 C)   Rectal examination: I'm only able to palpate the mid to inferior aspect of his gland which is without focal induration or nodularity.  PSA 3.92 from 05/29/2014  Impression: Stage T1c favorable risk adenocarcinoma prostate.  I explained to the patient that he should have a repeat PSA and even possible repeat biopsies at [facility].   I again told him that robotic surgery was my recommendation, but if he refuses surgery then we could discuss seed implantation or external beam/IMRT.  We again discussed the potential acute and  late toxicities of radiation therapy.  I'll get him set up to see Dr. Dutch Gray for consultation regarding robotic surgery.  His primary concern is preservation of potency and libido.  Plan: As above.  30 minutes was spent face-to-face with the patient, primarily counseling the patient and coordinating his care.

## 2015-09-09 NOTE — Progress Notes (Signed)
No changes in status since last visit on 11/20/15.  Urination pattern: Dribbling at end of stream , but no straining.  Nocturia x 1.   Still undecided about what course of treatment he wants to have.  Worried about his libido in the future.

## 2015-09-10 ENCOUNTER — Telehealth: Payer: Self-pay | Admitting: *Deleted

## 2015-09-10 NOTE — Telephone Encounter (Signed)
CALLED PATIENT TO INFORM OF APPT. WITH DR. Alinda Money ON 10-07-15- ARRIVAL TIME - 1:45 PM, SPOKE WITH PATIENT AND HE IS AWARE OF THIS APPT.

## 2016-01-28 ENCOUNTER — Emergency Department (HOSPITAL_COMMUNITY)
Admission: EM | Admit: 2016-01-28 | Discharge: 2016-01-29 | Disposition: A | Discharge status: Home / Self Care | Payer: BLUE CROSS/BLUE SHIELD | Attending: Emergency Medicine | Admitting: Emergency Medicine

## 2016-01-28 ENCOUNTER — Encounter (HOSPITAL_COMMUNITY): Payer: Self-pay

## 2016-01-28 DIAGNOSIS — R350 Frequency of micturition: Secondary | ICD-10-CM | POA: Diagnosis not present

## 2016-01-28 DIAGNOSIS — Z79899 Other long term (current) drug therapy: Secondary | ICD-10-CM | POA: Insufficient documentation

## 2016-01-28 DIAGNOSIS — I10 Essential (primary) hypertension: Secondary | ICD-10-CM | POA: Insufficient documentation

## 2016-01-28 DIAGNOSIS — R1013 Epigastric pain: Secondary | ICD-10-CM | POA: Diagnosis not present

## 2016-01-28 DIAGNOSIS — R079 Chest pain, unspecified: Secondary | ICD-10-CM | POA: Insufficient documentation

## 2016-01-28 DIAGNOSIS — Z8546 Personal history of malignant neoplasm of prostate: Secondary | ICD-10-CM | POA: Diagnosis not present

## 2016-01-28 DIAGNOSIS — R61 Generalized hyperhidrosis: Secondary | ICD-10-CM | POA: Diagnosis not present

## 2016-01-28 DIAGNOSIS — Z8719 Personal history of other diseases of the digestive system: Secondary | ICD-10-CM | POA: Diagnosis not present

## 2016-01-28 DIAGNOSIS — Z8669 Personal history of other diseases of the nervous system and sense organs: Secondary | ICD-10-CM | POA: Diagnosis not present

## 2016-01-28 DIAGNOSIS — R109 Unspecified abdominal pain: Secondary | ICD-10-CM | POA: Diagnosis present

## 2016-01-28 LAB — CBC
HCT: 45.1 % (ref 39.0–52.0)
Hemoglobin: 15.3 g/dL (ref 13.0–17.0)
MCH: 28.4 pg (ref 26.0–34.0)
MCHC: 33.9 g/dL (ref 30.0–36.0)
MCV: 83.7 fL (ref 78.0–100.0)
Platelets: 165 10*3/uL (ref 150–400)
RBC: 5.39 MIL/uL (ref 4.22–5.81)
RDW: 12.9 % (ref 11.5–15.5)
WBC: 4.3 10*3/uL (ref 4.0–10.5)

## 2016-01-28 LAB — I-STAT TROPONIN, ED: TROPONIN I, POC: 0 ng/mL (ref 0.00–0.08)

## 2016-01-28 MED ORDER — FAMOTIDINE IN NACL 20-0.9 MG/50ML-% IV SOLN
20.0000 mg | Freq: Once | INTRAVENOUS | Status: AC
  Administered 2016-01-29: 20 mg via INTRAVENOUS
  Filled 2016-01-28: qty 50

## 2016-01-28 MED ORDER — GI COCKTAIL ~~LOC~~
30.0000 mL | Freq: Once | ORAL | Status: AC
  Administered 2016-01-29: 30 mL via ORAL
  Filled 2016-01-28: qty 30

## 2016-01-28 NOTE — ED Provider Notes (Signed)
CSN: [id number]     Arrival date & time 01/28/16  2112 History  By signing my name below, I, Altamease Oiler, attest that this documentation has been prepared under the direction and in the presence of Virgel Manifold, MD. Electronically Signed: Altamease Oiler, ED Scribe. 01/28/2016. 11:48 PM   Chief Complaint  Patient presents with  . Abdominal Pain    The history is provided by the patient. No language interpreter was used.   Logan Contreras is a 55 y.o. male with history of acid reflux, HTN, and morbid obesity who presents to the Emergency Department complaining of constant central and left-sided chest pain with onset this morning upon waking. He describes the pain as "like indigestion" and notes that it worsened tonight. The pain has no modifying factors and was not improved by drinking water, vinegar, or a baking soda solution.  Associated symptoms include sweating and abdominal pain. Also complains of increased frequency. Pt denies nausea, belching, a sour taste in the mouth, and dysuria. He denies smoking and uses alcohol occasionally. Also denies frequent NSAID use.   Past Medical History  Diagnosis Date  . Hypertension   . Adenocarcinoma of prostate ([place]) 09/09/14    Gleason 6, vol 42.22 cc  . Morbid obesity ([place])   . Sleep apnea    Past Surgical History  Procedure Laterality Date  . Knee surgery    . Esophageal dilation    . Carpal tunnel release    . Prostate biopsy  09/09/14    Gleason 6, vol 42.22 cc   Family History  Problem Relation Age of Onset  . Dementia Mother   . Alzheimer's disease Father   . Cancer Father     prostate, surgery  . Cancer Brother     stomach  . Pancreatic cancer Sister    Social History  Substance Use Topics  . Smoking status: Never Smoker   . Smokeless tobacco: Never Used     Comment: smoked a little in high school  . Alcohol Use: 0.0 oz/week    0 Standard drinks or equivalent per week     Comment: socially    Review of Systems   Constitutional: Positive for diaphoresis.  Respiratory: Negative for shortness of breath.   Cardiovascular: Positive for chest pain.  Gastrointestinal: Positive for abdominal pain. Negative for nausea, vomiting, diarrhea and constipation.  Genitourinary: Positive for frequency. Negative for dysuria.  All other systems reviewed and are negative.  Allergies  Review of patient's allergies indicates no known allergies.  Home Medications   Prior to Admission medications   Medication Sig Start Date End Date Taking? Authorizing Provider  BENICAR HCT 40-25 MG per tablet Take 1 tablet by mouth daily. 02/02/15  Yes Historical Provider, MD  oxyCODONE-acetaminophen (PERCOCET) 10-325 MG tablet Take 1 tablet by mouth every 4 (four) hours as needed for pain.   Yes Historical Provider, MD   BP 143/74 mmHg  Pulse 97  Temp(Src) 99 F (37.2 C) (Oral)  Resp 21  SpO2 94% Physical Exam  Constitutional: He is oriented to person, place, and time. He appears well-developed and well-nourished.  HENT:  Head: Normocephalic and atraumatic.  Eyes: EOM are normal.  Neck: Normal range of motion.  Cardiovascular: Normal rate, regular rhythm, normal heart sounds and intact distal pulses.   Pulmonary/Chest: Effort normal and breath sounds normal. No respiratory distress.  Abdominal: Soft. He exhibits no distension. There is tenderness (mild) in the epigastric area. There is no rebound and no guarding.  Musculoskeletal: Normal  range of motion.  Neurological: He is alert and oriented to person, place, and time.  Skin: Skin is warm and dry.  Psychiatric: He has a normal mood and affect. Judgment normal.  Nursing note and vitals reviewed.   ED Course  Procedures (including critical care time) DIAGNOSTIC STUDIES: Oxygen Saturation is 94% on RA, adequate by my interpretation.    COORDINATION OF CARE: 11:29 PM Discussed treatment plan which includes lab work and EKG with pt at bedside and pt agreed to  plan.  Labs Review Labs Reviewed  COMPREHENSIVE METABOLIC PANEL - Abnormal; Notable for the following:    Chloride 99 (*)    Glucose, Bld 115 (*)    Creatinine, Ser 1.28 (*)    All other components within normal limits  LIPASE, BLOOD  CBC  URINALYSIS, ROUTINE W REFLEX MICROSCOPIC (NOT AT [facility])  I-STAT TROPOININ, ED    Imaging Review No results found. I have personally reviewed and evaluated these lab results as part of my medical decision-making.   EKG Interpretation   Date/Time:  Wednesday January 28 2016 21:46:57 EST Ventricular Rate:  99 PR Interval:  134 QRS Duration: 90 QT Interval:  323 QTC Calculation: 414 R Axis:   -18 Text Interpretation:  Sinus rhythm  left axis deviation No significant  change since last tracing Confirmed by Tashanti Dalporto  MD, Seleni Meller ([id number]) on  01/28/2016 11:51:07 PM      MDM   Final diagnoses:  Epigastric pain    54yM with epigastric pain. Mild tenderness w/o peritoneal signs. Maybe gastritis or PUD. Low suspicion for acute emergent process. It has been determined that no acute conditions requiring further emergency intervention are present at this time. The patient has been advised of the diagnosis and plan. I reviewed any labs and imaging including any potential incidental findings. We have discussed signs and symptoms that warrant return to the ED and they are listed in the discharge instructions.     Virgel Manifold, MD 02/08/16 [phone]

## 2016-01-28 NOTE — ED Notes (Signed)
RN will start a line and draw blood work

## 2016-01-28 NOTE — ED Notes (Signed)
Pt complains of indigestion type pain that started this am ans now it's moved to his chest and his side, he states that a baking soda drink helped earlier but it came back

## 2016-01-29 LAB — COMPREHENSIVE METABOLIC PANEL
ALT: 26 U/L (ref 17–63)
AST: 27 U/L (ref 15–41)
Albumin: 4.3 g/dL (ref 3.5–5.0)
Alkaline Phosphatase: 79 U/L (ref 38–126)
Anion gap: 10 (ref 5–15)
BUN: 9 mg/dL (ref 6–20)
CO2: 31 mmol/L (ref 22–32)
Calcium: 9.1 mg/dL (ref 8.9–10.3)
Chloride: 99 mmol/L — ABNORMAL LOW (ref 101–111)
Creatinine, Ser: 1.28 mg/dL — ABNORMAL HIGH (ref 0.61–1.24)
GFR calc Af Amer: >60 mL/min (ref >60)
GFR calc non Af Amer: >60 mL/min (ref >60)
Glucose, Bld: 115 mg/dL — ABNORMAL HIGH (ref 65–99)
Potassium: 3.6 mmol/L (ref 3.5–5.1)
Sodium: 140 mmol/L (ref 135–145)
Total Bilirubin: 0.6 mg/dL (ref 0.3–1.2)
Total Protein: 7.6 g/dL (ref 6.5–8.1)

## 2016-01-29 LAB — URINALYSIS, ROUTINE W REFLEX MICROSCOPIC
Bilirubin Urine: NEGATIVE
Glucose, UA: NEGATIVE mg/dL
Hgb urine dipstick: NEGATIVE
Ketones, ur: NEGATIVE mg/dL
Leukocytes, UA: NEGATIVE
Nitrite: NEGATIVE
Protein, ur: NEGATIVE mg/dL
Specific Gravity, Urine: 1.012 (ref 1.005–1.030)
pH: 7.5 (ref 5.0–8.0)

## 2016-01-29 LAB — LIPASE, BLOOD: Lipase: 30 U/L (ref 11–51)

## 2016-01-29 MED ORDER — PANTOPRAZOLE SODIUM 20 MG PO TBEC: 20.0000 mg | DELAYED_RELEASE_TABLET | Freq: Every day | ORAL | Status: DC

## 2016-01-29 MED ORDER — FAMOTIDINE 20 MG PO TABS: 20.0000 mg | ORAL_TABLET | Freq: Two times a day (BID) | ORAL | Status: DC

## 2016-01-29 NOTE — Discharge Instructions (Signed)

## 2016-03-29 NOTE — Telephone Encounter (Signed)
none

## 2016-04-06 ENCOUNTER — Emergency Department (HOSPITAL_COMMUNITY)
Admission: EM | Admit: 2016-04-06 | Discharge: 2016-04-06 | Disposition: A | Discharge status: Home / Self Care | Payer: BLUE CROSS/BLUE SHIELD | Attending: Emergency Medicine | Admitting: Emergency Medicine

## 2016-04-06 ENCOUNTER — Encounter (HOSPITAL_COMMUNITY): Payer: Self-pay | Admitting: Emergency Medicine

## 2016-04-06 DIAGNOSIS — Z8669 Personal history of other diseases of the nervous system and sense organs: Secondary | ICD-10-CM | POA: Diagnosis not present

## 2016-04-06 DIAGNOSIS — R1310 Dysphagia, unspecified: Secondary | ICD-10-CM | POA: Insufficient documentation

## 2016-04-06 DIAGNOSIS — Z79899 Other long term (current) drug therapy: Secondary | ICD-10-CM | POA: Diagnosis not present

## 2016-04-06 DIAGNOSIS — Z8546 Personal history of malignant neoplasm of prostate: Secondary | ICD-10-CM | POA: Diagnosis not present

## 2016-04-06 DIAGNOSIS — I10 Essential (primary) hypertension: Secondary | ICD-10-CM | POA: Insufficient documentation

## 2016-04-06 MED ORDER — GI COCKTAIL ~~LOC~~
30.0000 mL | Freq: Once | ORAL | Status: AC
  Administered 2016-04-06: 30 mL via ORAL
  Filled 2016-04-06: qty 30

## 2016-04-06 NOTE — ED Provider Notes (Signed)
CSN: [id number]     Arrival date & time 04/06/16  1740 History   First MD Initiated Contact with Patient 04/06/16 2009     Chief Complaint  Patient presents with  . Dysphagia     (Consider location/radiation/quality/duration/timing/severity/associated sxs/prior Treatment) HPI   Pt complains of"esophagus closing for years", had esophageal dilation in 2009 (states he is guessing dates), but it goes right back to how it was, began having worsening dysphagia yesterday at 2pm when he ate soft plantains and spinach,, felt it stuck in his chest with associated 3/10 burning pain across his anterior chest.  He was able to take sips of water and eat some yogurt last night, sat up for a while, and after some belching, felt some improvement and went to bed.  Today he attempted to eat spinach again, but regurgitated it back up.  He is vomiting up small sips of water, however tolerating secretions.  Currently he complains of 2/10 central chest pain, w/o radiation.   Nothing makes this better or worse, no associated hematemesis, coughing, fevers, chills, abdominal pain, N, diarrhea, SOB, diaphoresis  Per chart review: Mr. Lacey Jensen has visited the ER a few times over the past 2-3 years with similar complaints, including an impaction in 2015, where GI was going to do EGD, however he had spontaneous passage of impacted food, was able to tolerate liquids and was instead discharged home.  Questionable if he ever follow up with GI afterward, but he states he does not currently see a GI doctor.  Other ER visits he has been prescribed pepcid and protonix, but does not take any medicines.  He has also been diagnosed with HTN, OSA and prostate cancer, however states that he has HTN, and does not believe he has cancer or OSA.      Past Medical History  Diagnosis Date  . Hypertension   . 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 Morbid obesity ([place])   . Sleep apnea    Past Surgical  History  Procedure Laterality Date  . Knee surgery    . Esophageal dilation    . Carpal tunnel release    . Prostate biopsy  09/09/14    Gleason 6, vol 42.22 cc   Family History  Problem Relation Age of Onset  . Dementia Mother   . Alzheimer's disease Father   . Cancer Father     prostate, surgery  . Cancer Brother     stomach  . Pancreatic cancer Sister    Social History  Substance Use Topics  . Smoking status: Never Smoker   . Smokeless tobacco: Never Used     Comment: smoked a little in high school  . Alcohol Use: 0.0 oz/week    0 Standard drinks or equivalent per week     Comment: socially    Review of Systems  All other systems reviewed and are negative.     Allergies  Review of patient's allergies indicates no known allergies.  Home Medications   Prior to Admission medications   Medication Sig Start Date End Date Taking? Authorizing Provider  BENICAR HCT 40-25 MG per tablet Take 1 tablet by mouth daily. 02/02/15   Historical Provider, MD  famotidine (PEPCID) 20 MG tablet Take 1 tablet (20 mg total) by mouth 2 (two) times daily. 01/29/16   Virgel Manifold, MD  oxyCODONE-acetaminophen (PERCOCET) 10-325 MG tablet Take 1 tablet by mouth every 4 (four) hours as needed for pain.  Historical Provider, MD  pantoprazole (PROTONIX) 20 MG tablet Take 1 tablet (20 mg total) by mouth daily. 01/29/16   Virgel Manifold, MD   BP 140/97 mmHg  Pulse 75  Temp(Src) 98.8 F (37.1 C) (Oral)  Resp 18  Ht 5\' 8"  (1.727 m)  Wt 116.121 kg  BMI 38.93 kg/m2  SpO2 97% Physical Exam  Constitutional: He is oriented to person, place, and time. He appears well-developed and well-nourished. No distress.  Obese male, well appearing, NAD  HENT:  Head: Normocephalic and atraumatic.  Nose: Nose normal.  Mouth/Throat: Oropharynx is clear and moist. No oropharyngeal exudate.  Eyes: Conjunctivae and EOM are normal. Pupils are equal, round, and reactive to light. Right eye exhibits no discharge.  Left eye exhibits no discharge. No scleral icterus.  Neck: Normal range of motion. No JVD present. No tracheal deviation present. No thyromegaly present.  Cardiovascular: Normal rate, regular rhythm, normal heart sounds and intact distal pulses.  Exam reveals no gallop and no friction rub.   No murmur heard. Pulmonary/Chest: Effort normal and breath sounds normal. No respiratory distress. He has no wheezes. He has no rales. He exhibits no tenderness.  Abdominal: Soft. Bowel sounds are normal. He exhibits no distension and no mass. There is no tenderness. There is no rebound and no guarding.  Musculoskeletal: Normal range of motion. He exhibits no edema or tenderness.  Lymphadenopathy:    He has no cervical adenopathy.  Neurological: He is alert and oriented to person, place, and time. He has normal reflexes. No cranial nerve deficit. He exhibits normal muscle tone. Coordination normal.  Skin: Skin is warm and dry. No rash noted. He is not diaphoretic. No erythema. No pallor.  Psychiatric: He has a normal mood and affect. His behavior is normal. Judgment and thought content normal.  Nursing note and vitals reviewed.   ED Course  Procedures (including critical care time) Labs Review Labs Reviewed - No data to display  Imaging Review No results found. I have personally reviewed and evaluated these images and lab results as part of my medical decision-making.   EKG Interpretation None      MDM   55 year old patient with progressive dysphasia, difficulty with soft fluids and small sips of liquids today with regurgitation and vomiting. Able to tolerate secretions. He appears mildly uncomfortable but is living in the ER gurney, nontoxic in appearance, appears well-hydrated.  He has no abdominal tenderness.  GI consultation, I spoke with Dr. Henrene Pastor, case was reviewed and Dr. He stated the patient could be seen in the office tomorrow.  Dr. Henrene Pastor called back later, stating that he is a patient of  Dr. Ulyses Amor, and should see him.  Dr. Benson Norway paged.  10:18 PM spoke with Dr. Benson Norway, has seen pt in the past, asked that pt call the office at 8am to be seen.  He advised that if the patient appears stable and is starting secretions no indication to admit.  Patient was able tolerate GI cocktail, no active emesis while in the ER for >4hs.  He appears comfortable and is in agreement with plan to discharge home and contact GI first thing in the morning.  Pt was discharged in good condition, VSS.  Filed Vitals:   04/06/16 1749 04/06/16 2039 04/06/16 2221  BP: 140/97 145/88 132/77  Pulse: 75 62 69  Temp: 98.8 F (37.1 C)  97.8 F (36.6 C)  TempSrc: Oral  Oral  Resp: 18 21 20   Height: 5\' 8"  (1.727 m)  Weight: 116.121 kg    SpO2: 97% 96% 96%        Final diagnoses:  Dysphagia        Delsa Grana, PA-C 04/08/16 1929  Julianne Rice, MD 04/14/16 1655

## 2016-04-06 NOTE — Discharge Instructions (Signed)

## 2016-04-06 NOTE — ED Notes (Signed)
Pt states that he has had difficulty swallowing in the past and last night he had a bite of greens and plantains and still feels like it is stuck. States that he is able to swallow water with difficulty. Alert and oriented.

## 2016-08-20 ENCOUNTER — Ambulatory Visit (INDEPENDENT_AMBULATORY_CARE_PROVIDER_SITE_OTHER): Payer: BLUE CROSS/BLUE SHIELD

## 2016-08-20 ENCOUNTER — Encounter (HOSPITAL_COMMUNITY): Payer: Self-pay | Admitting: Emergency Medicine

## 2016-08-20 ENCOUNTER — Ambulatory Visit (HOSPITAL_COMMUNITY)
Admission: EM | Admit: 2016-08-20 | Discharge: 2016-08-20 | Disposition: A | Discharge status: Home / Self Care | Payer: BLUE CROSS/BLUE SHIELD | Attending: Family Medicine | Admitting: Family Medicine

## 2016-08-20 DIAGNOSIS — M25561 Pain in right knee: Secondary | ICD-10-CM

## 2016-08-20 DIAGNOSIS — M1711 Unilateral primary osteoarthritis, right knee: Secondary | ICD-10-CM

## 2016-08-20 MED ORDER — MELOXICAM 7.5 MG PO TABS: 7.5000 mg | ORAL_TABLET | Freq: Every day | ORAL | 0 refills | Status: DC

## 2016-08-20 NOTE — ED Triage Notes (Signed)
Pt here for right knee pain onset Wednesday  Reports hx of torn meniscus   States he is constantly on his feet all day at work and pain increases w/activity  A&O x4... NAD

## 2016-08-20 NOTE — ED Provider Notes (Signed)
CSN: [id number]     Arrival date & time 08/20/16  1443 History   First MD Initiated Contact with Patient 08/20/16 1513     Chief Complaint  Patient presents with  . Knee Pain   (Consider location/radiation/quality/duration/timing/severity/associated sxs/prior Treatment) HPI  Logan Contreras is a 55 y.o. male presenting to UC with c/o Right knee pain that started 2 days ago while at work.  Pt reports hx of torn meniscus from work related injury 4 years ago.  Pain is aching and sore, 8/10.  He occasionally has knee pain but it worsened most recently 2 days ago as he notes he has to be on his feet for long hours at a time.  No known injury.  Pain is worse to the medial superior aspect of his knee.  Worse with activity. He has not tried any acetaminophen or ibuprofen as pt states "that shit don't work."   He has not tried to f/u with surgery who performed the surgery as he notes it only recently started hurting real bad.   Past Medical History:  Diagnosis Date  . 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 Hypertension   . Morbid obesity ([place])   . Sleep apnea    Past Surgical History:  Procedure Laterality Date  . CARPAL TUNNEL RELEASE    . ESOPHAGEAL DILATION    . KNEE SURGERY    . PROSTATE BIOPSY  09/09/14   Gleason 6, vol 42.22 cc   Family History  Problem Relation Age of Onset  . Dementia Mother   . Alzheimer's disease Father   . Cancer Father     prostate, surgery  . Cancer Brother     stomach  . Pancreatic cancer Sister    Social History  Substance Use Topics  . Smoking status: Never Smoker  . Smokeless tobacco: Never Used     Comment: smoked a little in high school  . Alcohol use 0.0 oz/week     Comment: socially    Review of Systems  Musculoskeletal: Positive for arthralgias, gait problem, joint swelling and myalgias. Negative for back pain, neck pain and neck stiffness.       Right knee pain  Skin: Negative for color change and wound.   Neurological: Negative for weakness and numbness.    Allergies  Review of patient's allergies indicates no known allergies.  Home Medications   Prior to Admission medications   Medication Sig Start Date End Date Taking? Authorizing Provider  BENICAR HCT 40-25 MG per tablet Take 1 tablet by mouth daily. 02/02/15  Yes Historical Provider, MD  famotidine (PEPCID) 20 MG tablet Take 1 tablet (20 mg total) by mouth 2 (two) times daily. Patient not taking: Reported on 08/20/2016 01/29/16   Virgel Manifold, MD  meloxicam (MOBIC) 7.5 MG tablet Take 1-2 tablets (7.5-15 mg total) by mouth daily. Take 2 tabs daily for 7 days, then 1-2 tabs daily as needed for pain. 08/20/16   Noland Fordyce, PA-C  oxyCODONE-acetaminophen (PERCOCET) 10-325 MG tablet Take 1 tablet by mouth every 4 (four) hours as needed for pain.    Historical Provider, MD  pantoprazole (PROTONIX) 20 MG tablet Take 1 tablet (20 mg total) by mouth daily. Patient not taking: Reported on 08/20/2016 01/29/16   Virgel Manifold, MD   Meds Ordered and Administered this Visit  Medications - No data to display  BP 138/77 (BP Location: Right Arm)   Pulse 80   Temp 98.6 F (37 C) (  Oral)   Resp 14   SpO2 96%  No data found.   Physical Exam  Constitutional: He is oriented to person, place, and time. He appears well-developed and well-nourished.  HENT:  Head: Normocephalic and atraumatic.  Eyes: EOM are normal.  Neck: Normal range of motion.  Cardiovascular: Normal rate.   Pulmonary/Chest: Effort normal.  Musculoskeletal: Normal range of motion. He exhibits tenderness. He exhibits no edema or deformity.  Right knee: no edema, full ROM, tenderness to medial superior aspect of knee.  No tenderness in joint spaces.  No crepitus. Calf is soft, non-tender.  Neurological: He is alert and oriented to person, place, and time.  Skin: Skin is warm and dry.  Right knee: skin in tact, no ecchymosis, erythema or warmth.  Psychiatric: He has a normal mood  and affect. His behavior is normal.  Nursing note and vitals reviewed.   Urgent Care Course   Clinical Course    Procedures (including critical care time)  Labs Review Labs Reviewed - No data to display  Imaging Review Dg Knee Complete 4 Views Right  Result Date: 08/20/2016 CLINICAL DATA:  Onset of severe right knee pain 3 days ago. The pain is centered medial to the patella. No recent injury. The patient has had 3 operative procedures on the right knee and has a known torn meniscus. EXAM: RIGHT KNEE - COMPLETE 4+ VIEW COMPARISON:  None in PACs FINDINGS: The bones are subjectively adequately mineralized. There is no acute or healing fracture. Small spurs arise from the articular margins of the medial femoral condyle and medial tibial plateau. The joint spaces are reasonably well-maintained. There is a small suprapatellar effusion. Tiny spurs arise from the articular margins of the patella. IMPRESSION: Mild osteoarthritic spurring involving the medial femoral condyle and medial tibial plateau as well as the patella. Small suprapatellar effusion. No acute bony abnormality nor significant joint space loss. Electronically Signed   By: David  Martinique M.D.   On: 08/20/2016 15:39      MDM   1. Right knee pain   2. Osteoarthritis of right knee, unspecified osteoarthritis type    Pt c/o worsening Right knee pain.  No evidence of underlying infection. No known injury.  Plain films c/w OA of Right knee.   Rx: Meloxicam.  Encouraged f/u with [facility] for ongoing care of Right knee pain, may benefit from joint injections.     Noland Fordyce, PA-C 08/20/16 1555

## 2016-08-20 NOTE — Discharge Instructions (Signed)
°  Meloxicam (Mobic) is an antiinflammatory to help with pain and inflammation.  Do not take ibuprofen, Advil, Aleve, or any other medications that contain NSAIDs while taking meloxicam as this may cause stomach upset or even ulcers if taken in large amounts for an extended period of time.  ° °

## 2017-01-11 ENCOUNTER — Other Ambulatory Visit: Payer: Self-pay | Admitting: Infectious Disease

## 2017-01-11 ENCOUNTER — Ambulatory Visit
Admission: RE | Admit: 2017-01-11 | Discharge: 2017-01-11 | Disposition: A | Discharge status: Home / Self Care | Payer: No Typology Code available for payment source | Source: Ambulatory Visit | Attending: Infectious Disease | Admitting: Infectious Disease

## 2017-01-11 DIAGNOSIS — R7611 Nonspecific reaction to tuberculin skin test without active tuberculosis: Secondary | ICD-10-CM

## 2017-06-03 ENCOUNTER — Encounter (HOSPITAL_COMMUNITY): Payer: Self-pay | Admitting: Emergency Medicine

## 2017-06-03 ENCOUNTER — Emergency Department (HOSPITAL_COMMUNITY)
Admission: EM | Admit: 2017-06-03 | Discharge: 2017-06-03 | Disposition: A | Discharge status: Home / Self Care | Payer: BLUE CROSS/BLUE SHIELD | Attending: Emergency Medicine | Admitting: Emergency Medicine

## 2017-06-03 DIAGNOSIS — R42 Dizziness and giddiness: Secondary | ICD-10-CM | POA: Diagnosis present

## 2017-06-03 DIAGNOSIS — I10 Essential (primary) hypertension: Secondary | ICD-10-CM | POA: Insufficient documentation

## 2017-06-03 DIAGNOSIS — C61 Malignant neoplasm of prostate: Secondary | ICD-10-CM | POA: Diagnosis not present

## 2017-06-03 DIAGNOSIS — I160 Hypertensive urgency: Secondary | ICD-10-CM | POA: Insufficient documentation

## 2017-06-03 MED ORDER — BENICAR HCT 40-25 MG PO TABS: 1.0000 | ORAL_TABLET | Freq: Every day | ORAL | 1 refills | Status: DC

## 2017-06-03 MED FILL — OLMESARTAN-HCTZ 40-25 MG TA: 40-25 | 30 days supply | Qty: 30 | Fill #0

## 2017-06-03 NOTE — ED Provider Notes (Signed)
[place] DEPT Provider Note   CSN: [id number] Arrival date & time: 06/03/17  1004     History   Chief Complaint Chief Complaint  Patient presents with  . Hypertension    HPI Logan Contreras is a 56 y.o. male.  HPI Pt with hx of HTN with non compliance with his meds over the last 3 months comes in with cc of elevated BP and dizziness. Pt was at work and started getting dizzy when he stood up. The dizziness lasted for few seconds and resolved. Pt checked his BP and it was in the 190s and so he got concerned about a possible stroke. Pt had no nausea, numbness, tingling, weakness, vision changes, headaches. No hx of strokes. No dizziness at the moment.   Past Medical History:  Diagnosis Date  . 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 Hypertension   . Morbid obesity ([place])   . Sleep apnea     Patient Active Problem List   Diagnosis Date Noted  . Malignant neoplasm of prostate ([place]) 11/13/2014    Past Surgical History:  Procedure Laterality Date  . CARPAL TUNNEL RELEASE    . ESOPHAGEAL DILATION    . KNEE SURGERY    . PROSTATE BIOPSY  09/09/14   Gleason 6, vol 42.22 cc       Home Medications    Prior to Admission medications   Medication Sig Start Date End Date Taking? Authorizing Provider  BENICAR HCT 40-25 MG tablet Take 1 tablet by mouth daily. 06/03/17   Varney Biles, MD  famotidine (PEPCID) 20 MG tablet Take 1 tablet (20 mg total) by mouth 2 (two) times daily. Patient not taking: Reported on 08/20/2016 01/29/16   Virgel Manifold, MD  meloxicam (MOBIC) 7.5 MG tablet Take 1-2 tablets (7.5-15 mg total) by mouth daily. Take 2 tabs daily for 7 days, then 1-2 tabs daily as needed for pain. 08/20/16   Noe Gens, PA-C  oxyCODONE-acetaminophen (PERCOCET) 10-325 MG tablet Take 1 tablet by mouth every 4 (four) hours as needed for pain.    [provider]  pantoprazole (PROTONIX) 20 MG tablet Take 1 tablet (20 mg total) by  mouth daily. Patient not taking: Reported on 08/20/2016 01/29/16   Virgel Manifold, MD    Family History Family History  Problem Relation Age of Onset  . Dementia Mother   . Alzheimer's disease Father   . Cancer Father        prostate, surgery  . Cancer Brother        stomach  . Pancreatic cancer Sister     Social History Social History  Substance Use Topics  . Smoking status: Never Smoker  . Smokeless tobacco: Never Used     Comment: smoked a little in high school  . Alcohol use 0.0 oz/week     Comment: socially     Allergies   Patient has no known allergies.   Review of Systems Review of Systems  Respiratory: Negative for shortness of breath.   Cardiovascular: Negative for chest pain.  Neurological: Positive for dizziness and light-headedness.  All other systems reviewed and are negative.    Physical Exam Updated Vital Signs BP (!) 145/93 (BP Location: Left Arm)   Pulse 82   Temp 97.8 F (36.6 C) (Oral)   Resp 16   Ht 5\' 9"  (1.753 m)   Wt 122.5 kg (270 lb)   SpO2 100%   BMI 39.87 kg/m   Physical Exam  Constitutional: He is oriented to person, place, and time. He appears well-developed.  HENT:  Head: Normocephalic and atraumatic.  Eyes: Conjunctivae and EOM are normal. Pupils are equal, round, and reactive to light.  Neck: Normal range of motion. Neck supple.  Cardiovascular: Normal rate and regular rhythm.   Pulmonary/Chest: Effort normal and breath sounds normal.  Abdominal: Soft. Bowel sounds are normal. He exhibits no distension and no mass. There is no tenderness. There is no rebound and no guarding.  Musculoskeletal: He exhibits no deformity.  Neurological: He is alert and oriented to person, place, and time. No cranial nerve deficit. Coordination normal.  Cerebellar exam is normal (finger to nose) Sensory exam normal for bilateral upper and lower extremities - and patient is able to discriminate between sharp and dull. Motor exam is 4+/5 Pt  ambulated w/o any problems or ataxia or unsteadiness.  Skin: Skin is warm.  Nursing note and vitals reviewed.    ED Treatments / Results  Labs (all labs ordered are listed, but only abnormal results are displayed) Labs Reviewed - No data to display  EKG  EKG Interpretation  Date/Time:  Friday June 03 2017 12:16:15 EDT Ventricular Rate:  83 PR Interval:    QRS Duration: 100 QT Interval:  363 QTC Calculation: 427 R Axis:   -11 Text Interpretation:  Sinus rhythm Nonspecific T abnormalities, lateral leads No acute changes No significant change since last tracing Confirmed by Varney Biles [phone]) on 06/03/2017 12:48:24 PM       Radiology No results found.  Procedures Procedures (including critical care time)  Medications Ordered in ED Medications - No data to display   Initial Impression / Assessment and Plan / ED Course  I have reviewed the triage vital signs and the nursing notes.  Pertinent labs & imaging results that were available during my care of the patient were reviewed by me and considered in my medical decision making (see chart for details).    Pt comes in with cc of dizziness. Pt also was noted to have elevated BP when he arrived, but not as high as he reportedly had at work. Neuro exam is nonfocal, steady gait. I dont think he had a stroke / TIA. Will start him back on his BP meds and advise pcp f/u.  Final Clinical Impressions(s) / ED Diagnoses   Final diagnoses:  Hypertensive urgency  Dizziness    New Prescriptions Current Discharge Medication List       Varney Biles, MD 06/03/17 [phone]

## 2017-06-03 NOTE — Discharge Instructions (Signed)
Please return to the ER if there is new one sided numbness, tingling, weakness or confusion, seizures, poor balance or poor vision.

## 2017-06-03 NOTE — ED Notes (Signed)
Patient reports he has had chest pain for the last three days but is not having it now.  Does have some diaphoresis and did have a headache but is not having it now.  Patient is alert and oriented using his phone.

## 2017-06-03 NOTE — ED Triage Notes (Signed)
Pt verbalizes high blood pressure over past 3 days with home reading 191/99. Pt out of medications for 3 months. Pt verbalizes headache and diaphoresis; denies other.

## 2017-06-27 MED FILL — OLMESARTAN-HCTZ 40-25 MG TA: 40-25 | 30 days supply | Qty: 30 | Fill #1

## 2017-08-23 ENCOUNTER — Ambulatory Visit (HOSPITAL_COMMUNITY)
Admission: EM | Admit: 2017-08-23 | Discharge: 2017-08-23 | Disposition: A | Discharge status: Home / Self Care | Payer: BLUE CROSS/BLUE SHIELD | Attending: Family Medicine | Admitting: Family Medicine

## 2017-08-23 ENCOUNTER — Encounter (HOSPITAL_COMMUNITY): Payer: Self-pay | Admitting: *Deleted

## 2017-08-23 DIAGNOSIS — I10 Essential (primary) hypertension: Secondary | ICD-10-CM | POA: Diagnosis not present

## 2017-08-23 MED ORDER — BENICAR HCT 40-25 MG PO TABS: 1.0000 | ORAL_TABLET | Freq: Every day | ORAL | 3 refills | Status: DC

## 2017-08-23 MED FILL — OLMESARTAN-HCTZ 40-25 MG TA: 40-25 | 90 days supply | Qty: 90 | Fill #0

## 2017-08-23 NOTE — ED Triage Notes (Addendum)
Patient states he ran out of his blood pressure medication, Benicar. Reports intermittent headache.

## 2017-08-23 NOTE — ED Provider Notes (Signed)
[place]   [id number] 08/23/17 Arrival Time: 1222   SUBJECTIVE:  Logan Contreras is a 56 y.o. male who presents to the urgent care with complaint that he ran out of his blood pressure medication, Benicar. Reports intermittent headache in occipital area (dull). No chest pain, shortness of breath, or leg swelling.  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  Taking wife's meds.   Past Medical History:  Diagnosis Date  . 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 Hypertension   . Morbid obesity ([place])   . Sleep apnea    Family History  Problem Relation Age of Onset  . Dementia Mother   . Alzheimer's disease Father   . Cancer Father        prostate, surgery  . Cancer Brother        stomach  . Pancreatic cancer Sister    Social History   Social History  . Marital status: Married    Spouse name: N/A  . Number of children: 5  . Years of education: N/A   Occupational History  .  [employer]   Social History Main Topics  . Smoking status: Never Smoker  . Smokeless tobacco: Never Used     Comment: smoked a little in high school  . Alcohol use 0.0 oz/week     Comment: socially  . Drug use: No  . Sexual activity: Not on file   Other Topics Concern  . Not on file   Social History Narrative  . No narrative on file   Current Meds  Medication Sig  . [DISCONTINUED] BENICAR HCT 40-25 MG tablet Take 1 tablet by mouth daily.   No Known Allergies    ROS: As per HPI, remainder of ROS negative.   OBJECTIVE:   Vitals:   08/23/17 1248  BP: (!) 141/87  Pulse: 90  Resp: 18  Temp: 98.2 F (36.8 C)  SpO2: 100%     General appearance: alert; no distress Eyes: PERRL; EOMI; conjunctiva normal HENT: normocephalic; atraumatic; TMs normal, canal normal, external ears normal without trauma; nasal mucosa normal; oral mucosa normal Neck: supple Extremities: no cyanosis or edema; symmetrical with no gross deformities Skin: warm and  dry Neurologic: normal gait; grossly normal Psychological: alert and cooperative; normal mood and affect      Labs:  Results for orders placed or performed during the hospital encounter of 01/28/16  Lipase, blood  Result Value Ref Range   Lipase 30 11 - 51 U/L  Comprehensive metabolic panel  Result Value Ref Range   Sodium 140 135 - 145 mmol/L   Potassium 3.6 3.5 - 5.1 mmol/L   Chloride 99 (L) 101 - 111 mmol/L   CO2 31 22 - 32 mmol/L   Glucose, Bld 115 (H) 65 - 99 mg/dL   BUN 9 6 - 20 mg/dL   Creatinine, Ser 1.28 (H) 0.61 - 1.24 mg/dL   Calcium 9.1 8.9 - 10.3 mg/dL   Total Protein 7.6 6.5 - 8.1 g/dL   Albumin 4.3 3.5 - 5.0 g/dL   AST 27 15 - 41 U/L   ALT 26 17 - 63 U/L   Alkaline Phosphatase 79 38 - 126 U/L   Total Bilirubin 0.6 0.3 - 1.2 mg/dL   GFR calc non Af Amer >60 >60 mL/min   GFR calc Af Amer >60 >60 mL/min   Anion gap 10 5 - 15  CBC  Result Value Ref Range   WBC 4.3 4.0 -  10.5 K/uL   RBC 5.39 4.22 - 5.81 MIL/uL   Hemoglobin 15.3 13.0 - 17.0 g/dL   HCT 45.1 39.0 - 52.0 %   MCV 83.7 78.0 - 100.0 fL   MCH 28.4 26.0 - 34.0 pg   MCHC 33.9 30.0 - 36.0 g/dL   RDW 12.9 11.5 - 15.5 %   Platelets 165 150 - 400 K/uL  Urinalysis, Routine w reflex microscopic (not at [facility])  Result Value Ref Range   Color, Urine YELLOW YELLOW   APPearance CLEAR CLEAR   Specific Gravity, Urine 1.012 1.005 - 1.030   pH 7.5 5.0 - 8.0   Glucose, UA NEGATIVE NEGATIVE mg/dL   Hgb urine dipstick NEGATIVE NEGATIVE   Bilirubin Urine NEGATIVE NEGATIVE   Ketones, ur NEGATIVE NEGATIVE mg/dL   Protein, ur NEGATIVE NEGATIVE mg/dL   Nitrite NEGATIVE NEGATIVE   Leukocytes, UA NEGATIVE NEGATIVE  I-Stat Troponin, ED (not at [facility])  Result Value Ref Range   Troponin i, poc 0.00 0.00 - 0.08 ng/mL   Comment 3            Labs Reviewed - No data to display  No results found.     ASSESSMENT & PLAN:  1. Essential hypertension     Meds ordered this encounter  Medications  . BENICAR HCT  40-25 MG tablet    Sig: Take 1 tablet by mouth daily.    Dispense:  90 tablet    Refill:  3    Reviewed expectations re: course of current medical issues. Questions answered. Outlined signs and symptoms indicating need for more acute intervention. Patient verbalized understanding. After Visit Summary given.    Procedures:      Robyn Haber, MD 08/23/17 1259

## 2018-03-27 ENCOUNTER — Ambulatory Visit (HOSPITAL_COMMUNITY)
Admission: EM | Admit: 2018-03-27 | Discharge: 2018-03-27 | Disposition: A | Discharge status: Home / Self Care | Payer: BLUE CROSS/BLUE SHIELD | Attending: Family Medicine | Admitting: Family Medicine

## 2018-03-27 ENCOUNTER — Encounter (HOSPITAL_COMMUNITY): Payer: Self-pay | Admitting: Family Medicine

## 2018-03-27 DIAGNOSIS — I10 Essential (primary) hypertension: Secondary | ICD-10-CM | POA: Diagnosis not present

## 2018-03-27 MED ORDER — OLMESARTAN MEDOXOMIL-HCTZ 40-25 MG PO TABS: 1.0000 | ORAL_TABLET | Freq: Every day | ORAL | 3 refills | Status: DC

## 2018-03-27 NOTE — ED Triage Notes (Addendum)
Pt here reporting headache that started 4 days ago due to blood pressure. He is out of BP meds. He checked BP at home and it was 170/120. He took one of his wifes pills last night.

## 2018-03-27 NOTE — ED Provider Notes (Signed)
[place]   [id number] 03/27/18 Arrival Time: 0354   SUBJECTIVE:  [facility]   [id number] 03/27/18 Arrival Time: 9   SUBJECTIVE:  Logan Contreras is a 57 y.o. male who presents to the urgent care with complaint of headache that started 4 days ago due to blood pressure. He is out of BP meds. He checked BP at home and it was 170/120. He took one of his wifes pills last night.   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   Past Medical History:  Diagnosis Date  . 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 Hypertension   . Morbid obesity ([place])   . Sleep apnea    Family History  Problem Relation Age of Onset  . Dementia Mother   . Alzheimer's disease Father   . Cancer Father        prostate, surgery  . Cancer Brother        stomach  . Pancreatic cancer Sister    Social History   Socioeconomic History  . Marital status: Married    Spouse name: Not on file  . Number of children: 5  . Years of education: Not on file  . Highest education level: Not on file  Occupational History    Employer: [employer]  . Financial resource strain: Not on file  . Food insecurity:    Worry: Not on file    Inability: Not on file  . Transportation needs:    Medical: Not on file    Non-medical: Not on file  Tobacco Use  . Smoking status: Never Smoker  . Smokeless tobacco: Never Used  . Tobacco comment: smoked a little in high school  Substance and Sexual Activity  . Alcohol use: Yes    Alcohol/week: 0.0 oz    Comment: socially  . Drug use: No  . Sexual activity: Not on file  Lifestyle  . Physical activity:    Days per week: Not on file    Minutes per session: Not on file  . Stress: Not on file  Relationships  . Social connections:    Talks on phone: Not on file    Gets together: Not on file    Attends religious service: Not on file    Active member of  club or organization: Not on file    Attends meetings of clubs or organizations: Not on file    Relationship status: Not on file  . Intimate partner violence:    Fear of current or ex partner: Not on file    Emotionally abused: Not on file    Physically abused: Not on file    Forced sexual activity: Not on file  Other Topics Concern  . Not on file  Social History Narrative  . Not on file   Current Meds  Medication Sig  . [DISCONTINUED] olmesartan-hydrochlorothiazide (BENICAR HCT) 40-25 MG tablet Take 1 tablet by mouth daily.   No Known Allergies    ROS: As per HPI, remainder of ROS negative.   OBJECTIVE:   Vitals:   03/27/18 1511  BP: (!) 144/64  Pulse: 84  Resp: 18  Temp: 98 F (36.7 C)     General appearance: alert; no distress Eyes: PERRL; EOMI; conjunctiva normal HENT: normocephalic; atraumatic; TMs normal, canal normal, external ears normal without trauma; nasal mucosa normal; oral mucosa normal Neck: supple Lungs: clear to auscultation  bilaterally Heart: regular rate and rhythm Abdomen: soft, non-tender; bowel sounds normal; no masses or organomegaly; no guarding or rebound tenderness Back: no CVA tenderness Extremities: no cyanosis or edema; symmetrical with no gross deformities Skin: warm and dry Neurologic: normal gait; grossly normal Psychological: alert and cooperative; normal mood and affect      Labs:  Results for orders placed or performed during the hospital encounter of 01/28/16  Lipase, blood  Result Value Ref Range   Lipase 30 11 - 51 U/L  Comprehensive metabolic panel  Result Value Ref Range   Sodium 140 135 - 145 mmol/L   Potassium 3.6 3.5 - 5.1 mmol/L   Chloride 99 (L) 101 - 111 mmol/L   CO2 31 22 - 32 mmol/L   Glucose, Bld 115 (H) 65 - 99 mg/dL   BUN 9 6 - 20 mg/dL   Creatinine, Ser 1.28 (H) 0.61 - 1.24 mg/dL   Calcium 9.1 8.9 - 10.3 mg/dL   Total Protein 7.6 6.5 - 8.1 g/dL   Albumin 4.3 3.5 - 5.0 g/dL   AST 27 15 - 41 U/L     ALT 26 17 - 63 U/L   Alkaline Phosphatase 79 38 - 126 U/L   Total Bilirubin 0.6 0.3 - 1.2 mg/dL   GFR calc non Af Amer >60 >60 mL/min   GFR calc Af Amer >60 >60 mL/min   Anion gap 10 5 - 15  CBC  Result Value Ref Range   WBC 4.3 4.0 - 10.5 K/uL   RBC 5.39 4.22 - 5.81 MIL/uL   Hemoglobin 15.3 13.0 - 17.0 g/dL   HCT 45.1 39.0 - 52.0 %   MCV 83.7 78.0 - 100.0 fL   MCH 28.4 26.0 - 34.0 pg   MCHC 33.9 30.0 - 36.0 g/dL   RDW 12.9 11.5 - 15.5 %   Platelets 165 150 - 400 K/uL  Urinalysis, Routine w reflex microscopic (not at [facility])  Result Value Ref Range   Color, Urine YELLOW YELLOW   APPearance CLEAR CLEAR   Specific Gravity, Urine 1.012 1.005 - 1.030   pH 7.5 5.0 - 8.0   Glucose, UA NEGATIVE NEGATIVE mg/dL   Hgb urine dipstick NEGATIVE NEGATIVE   Bilirubin Urine NEGATIVE NEGATIVE   Ketones, ur NEGATIVE NEGATIVE mg/dL   Protein, ur NEGATIVE NEGATIVE mg/dL   Nitrite NEGATIVE NEGATIVE   Leukocytes, UA NEGATIVE NEGATIVE  I-Stat Troponin, ED (not at [facility])  Result Value Ref Range   Troponin i, poc 0.00 0.00 - 0.08 ng/mL   Comment 3            Labs Reviewed - No data to display  No results found.     ASSESSMENT & PLAN:  1. Essential hypertension     Meds ordered this encounter  Medications  . olmesartan-hydrochlorothiazide (BENICAR HCT) 40-25 MG tablet    Sig: Take 1 tablet by mouth daily.    Dispense:  90 tablet    Refill:  3    Reviewed expectations re: course of current medical issues. Questions answered. Outlined signs and symptoms indicating need for more acute intervention. Patient verbalized understanding. After Visit Summary given.    Procedures:      Robyn Haber, MD 03/27/18 1526

## 2018-07-02 ENCOUNTER — Emergency Department (HOSPITAL_COMMUNITY)
Admission: EM | Admit: 2018-07-02 | Discharge: 2018-07-03 | Disposition: A | Discharge status: Home / Self Care | Payer: BLUE CROSS/BLUE SHIELD | Source: Home / Self Care | Attending: Emergency Medicine | Admitting: Emergency Medicine

## 2018-07-02 ENCOUNTER — Emergency Department (HOSPITAL_COMMUNITY): Payer: BLUE CROSS/BLUE SHIELD

## 2018-07-02 ENCOUNTER — Encounter (HOSPITAL_COMMUNITY): Payer: Self-pay

## 2018-07-02 ENCOUNTER — Other Ambulatory Visit: Payer: Self-pay

## 2018-07-02 DIAGNOSIS — G4730 Sleep apnea, unspecified: Secondary | ICD-10-CM | POA: Diagnosis not present

## 2018-07-02 DIAGNOSIS — Z820 Family history of epilepsy and other diseases of the nervous system: Secondary | ICD-10-CM | POA: Diagnosis not present

## 2018-07-02 DIAGNOSIS — Z8042 Family history of malignant neoplasm of prostate: Secondary | ICD-10-CM | POA: Diagnosis not present

## 2018-07-02 DIAGNOSIS — R1310 Dysphagia, unspecified: Secondary | ICD-10-CM | POA: Insufficient documentation

## 2018-07-02 DIAGNOSIS — Z79899 Other long term (current) drug therapy: Secondary | ICD-10-CM | POA: Insufficient documentation

## 2018-07-02 DIAGNOSIS — R0789 Other chest pain: Secondary | ICD-10-CM

## 2018-07-02 DIAGNOSIS — I10 Essential (primary) hypertension: Secondary | ICD-10-CM | POA: Insufficient documentation

## 2018-07-02 DIAGNOSIS — K2210 Ulcer of esophagus without bleeding: Secondary | ICD-10-CM | POA: Diagnosis not present

## 2018-07-02 DIAGNOSIS — Z800 Family history of malignant neoplasm of digestive organs: Secondary | ICD-10-CM | POA: Diagnosis not present

## 2018-07-02 DIAGNOSIS — K222 Esophageal obstruction: Secondary | ICD-10-CM | POA: Diagnosis not present

## 2018-07-02 DIAGNOSIS — Z8546 Personal history of malignant neoplasm of prostate: Secondary | ICD-10-CM

## 2018-07-02 LAB — BASIC METABOLIC PANEL
Anion gap: 9 (ref 5–15)
BUN: 15 mg/dL (ref 6–20)
CALCIUM: 9.3 mg/dL (ref 8.9–10.3)
CO2: 29 mmol/L (ref 22–32)
CREATININE: 1.19 mg/dL (ref 0.61–1.24)
Chloride: 102 mmol/L (ref 98–111)
GFR calc Af Amer: >60 mL/min (ref >60)
Glucose, Bld: 97 mg/dL (ref 70–99)
Potassium: 4.3 mmol/L (ref 3.5–5.1)
Sodium: 140 mmol/L (ref 135–145)

## 2018-07-02 LAB — HEPATIC FUNCTION PANEL
ALBUMIN: 4.2 g/dL (ref 3.5–5.0)
ALT: 35 U/L (ref 0–44)
AST: 36 U/L (ref 15–41)
Alkaline Phosphatase: 74 U/L (ref 38–126)
BILIRUBIN TOTAL: 0.9 mg/dL (ref 0.3–1.2)
Bilirubin, Direct: 0.2 mg/dL (ref 0.0–0.2)
Indirect Bilirubin: 0.7 mg/dL (ref 0.3–0.9)
TOTAL PROTEIN: 7.2 g/dL (ref 6.5–8.1)

## 2018-07-02 LAB — LIPASE, BLOOD: LIPASE: 66 U/L — AB (ref 11–51)

## 2018-07-02 LAB — CBC
HCT: 44.5 % (ref 39.0–52.0)
Hemoglobin: 15.3 g/dL (ref 13.0–17.0)
MCH: 28.5 pg (ref 26.0–34.0)
MCHC: 34.4 g/dL (ref 30.0–36.0)
MCV: 82.9 fL (ref 78.0–100.0)
PLATELETS: 225 10*3/uL (ref 150–400)
RBC: 5.37 MIL/uL (ref 4.22–5.81)
RDW: 13.3 % (ref 11.5–15.5)
WBC: 9.8 10*3/uL (ref 4.0–10.5)

## 2018-07-02 LAB — TROPONIN I

## 2018-07-02 MED ORDER — GLUCAGON HCL RDNA (DIAGNOSTIC) 1 MG IJ SOLR
1.0000 mg | Freq: Once | INTRAMUSCULAR | Status: AC
  Administered 2018-07-02: 1 mg via INTRAVENOUS
  Filled 2018-07-02: qty 1

## 2018-07-02 MED ORDER — GI COCKTAIL ~~LOC~~
30.0000 mL | Freq: Once | ORAL | Status: AC
  Administered 2018-07-02: 30 mL via ORAL
  Filled 2018-07-02: qty 30

## 2018-07-02 MED ORDER — SODIUM CHLORIDE 0.9 % IV BOLUS
1000.0000 mL | Freq: Once | INTRAVENOUS | Status: AC
  Administered 2018-07-02: 1000 mL via INTRAVENOUS

## 2018-07-02 MED ORDER — MORPHINE SULFATE (PF) 4 MG/ML IV SOLN
4.0000 mg | Freq: Once | INTRAVENOUS | Status: AC
  Administered 2018-07-02: 4 mg via INTRAVENOUS
  Filled 2018-07-02: qty 1

## 2018-07-02 NOTE — ED Notes (Signed)
Bed: WA05 Expected date:  Expected time:  Means of arrival:  Comments: 

## 2018-07-02 NOTE — ED Provider Notes (Addendum)
[facility] DEPT Provider Note   CSN: [id number] Arrival date & time: 07/02/18  1830     History   Chief Complaint Chief Complaint  Patient presents with  . Chest Pain  . Dysphagia    HPI Logan Contreras is a 57 y.o. male with history of hypertension, esophageal stricture requiring dilation 2010 is here for evaluation of sternal notch discomfort.  States that he was eating a crab cake around 6 PM when he had sudden sensation like it got stuck in his throat, he drank water and felt like it slid further down but got stuck lower in the central chest.  Since, he has had burning, radiating pain from his sternal notch to his throat, intermittent.  Feels like walking sometimes make it worse.  Has not tried to eat or drink anything since.  He has been tolerating his saliva without drooling.  Tried to make himself throw up and wife noticed something red in the toilet unsure if it was blood however patient states that he had a strawberry/blueberry smoothie earlier today.  Spit up some clear mucus but no frank coughing.  Last had a esophageal dilation by Dr. Almyra Free in 2010.  Since, he has had intermittent dysphagia but no other needs for dilation.  Nausea has resolved.  No cough, fevers, abdominal pain, shortness of breath, pleuritic chest pain. HPI  Past Medical History:  Diagnosis Date  . 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 Hypertension   . Morbid obesity ([place])   . Sleep apnea     Patient Active Problem List   Diagnosis Date Noted  . Malignant neoplasm of prostate ([place]) 11/13/2014    Past Surgical History:  Procedure Laterality Date  . CARPAL TUNNEL RELEASE    . ESOPHAGEAL DILATION    . KNEE SURGERY    . PROSTATE BIOPSY  09/09/14   Gleason 6, vol 42.22 cc        Home Medications    Prior to Admission medications   Medication Sig Start Date End Date Taking? Authorizing Provider  olmesartan-hydrochlorothiazide  (BENICAR HCT) 40-25 MG tablet Take 1 tablet by mouth daily. 03/27/18  Yes Robyn Haber, MD    Family History Family History  Problem Relation Age of Onset  . Dementia Mother   . Alzheimer's disease Father   . Cancer Father        prostate, surgery  . Cancer Brother        stomach  . Pancreatic cancer Sister     Social History Social History   Tobacco Use  . Smoking status: Never Smoker  . Smokeless tobacco: Never Used  . Tobacco comment: smoked a little in high school  Substance Use Topics  . Alcohol use: Yes    Alcohol/week: 0.0 oz    Comment: socially  . Drug use: No     Allergies   Patient has no known allergies.   Review of Systems Review of Systems  HENT: Positive for trouble swallowing.   Cardiovascular: Positive for chest pain.  All other systems reviewed and are negative.    Physical Exam Updated Vital Signs BP (!) 141/78 (BP Location: Left Arm)   Pulse 82   Resp 18   SpO2 98%   Physical Exam  Constitutional: He is oriented to person, place, and time. He appears well-developed and well-nourished. No distress.  NAD.  HENT:  Head: Normocephalic and atraumatic.  Right Ear: External ear normal.  Left Ear:  External ear normal.  Nose: Nose normal.  Oropharynx and tonsils normal. Tolerating secretions. Normal phonation.  Able to take sips of water without regurgitation, coughing, vomiting, drooling but discomfort at sternal notch  Eyes: Conjunctivae and EOM are normal. No scleral icterus.  Neck: Normal range of motion. Neck supple.  Cardiovascular: Normal rate, regular rhythm, normal heart sounds and intact distal pulses.  2+ radial and DP pulses bilaterally. No chest wall tenderness.   Pulmonary/Chest: Effort normal and breath sounds normal.  No pain with deep inspiration   Abdominal: Soft. There is no tenderness.  No epigastric tenderness  Musculoskeletal: Normal range of motion. He exhibits no deformity.  Neurological: He is alert and oriented  to person, place, and time.  Skin: Skin is warm and dry. Capillary refill takes less than 2 seconds.  Psychiatric: He has a normal mood and affect. His behavior is normal. Judgment and thought content normal.  Nursing note and vitals reviewed.    ED Treatments / Results  Labs (all labs ordered are listed, but only abnormal results are displayed) Labs Reviewed  BASIC METABOLIC PANEL  CBC  TROPONIN I  LIPASE, BLOOD  HEPATIC FUNCTION PANEL    EKG EKG Interpretation  Date/Time:  Sunday July 02 2018 18:39:04 EDT Ventricular Rate:  87 PR Interval:    QRS Duration: 94 QT Interval:  337 QTC Calculation: 406 R Axis:   -13 Text Interpretation:  Sinus rhythm Abnormal R-wave progression, early transition Nonspecific T abnormalities, lateral leads No significant change since last tracing Confirmed by Blanchie Dessert [phone]) on 07/02/2018 9:15:32 PM   Radiology Dg Chest 2 View  Result Date: 07/02/2018 CLINICAL DATA:  Chest pain EXAM: CHEST - 2 VIEW COMPARISON:  01/11/2017 FINDINGS: Heart and mediastinal contours are within normal limits. No focal opacities or effusions. No acute bony abnormality. IMPRESSION: No active cardiopulmonary disease. Electronically Signed   By: Rolm Baptise M.D.   On: 07/02/2018 19:06    Procedures Procedures (including critical care time)  Medications Ordered in ED Medications  morphine 4 MG/ML injection 4 mg (has no administration in time range)  sodium chloride 0.9 % bolus 1,000 mL (has no administration in time range)  gi cocktail (Maalox,Lidocaine,Donnatal) (has no administration in time range)  glucagon (human recombinant) (GLUCAGEN) injection 1 mg (1 mg Intravenous Given 07/02/18 2118)  gi cocktail (Maalox,Lidocaine,Donnatal) (30 mLs Oral Given 07/02/18 2117)     Initial Impression / Assessment and Plan / ED Course  I have reviewed the triage vital signs and the nursing notes.  Pertinent labs & imaging results that were available during my care  of the patient were reviewed by me and considered in my medical decision making (see chart for details).  Clinical Course as of Jul 03 2323  Nancy Fetter Jul 02, 2018  2306 Re-evaluated pt.  Initially felt better however now feels like discomfort has returned. He is requesting another dose of GI cocktail.  Will give morphine, GI cocktail, IVF.     [CG]    Clinical Course User Index [CG] Kinnie Feil, PA-C   Concern for superficial tear.  He has no changes in his voice, drooling, regurgitation, vomiting, cough, shortness of breath making complete impaction or perforation unlikely.  2320: Work-up including cardiac work-up reviewed and unremarkable.  Initially, patient symptoms responded moderately to glucagon and GI cocktail.  I had spoken to GI (Dr. Havery Moros) who was reassured based on symptoms and exam, deems patient appropriate for discharge.  GI office to call patient in the morning  to do a symptom check and determine appropriate follow-up.  However, when I reevaluated patient a third time, he felt his pain had worsened and was worsened initially.  It has become more frequent even without swallowing.  Abdomen remains soft, nondistended and nontender.  Hemodynamically stable.  Continues to tolerate fluids without regurgitation, vomiting, cough, drooling.  Given return, worsening symptoms will obtain CT chest/neck, re-rder GI cocktail, morphine and start IV fluids.  Patient will be handed off to oncoming EDP who will reassess patient.  Consider reconsult GI if symptoms refractory. Pt may need EGD if there is clinical decline.  Discussed transfer of care with patient and wife who are in agreement. If appropriate for dc GI recommends carafate q6, liquid diet.   Final Clinical Impressions(s) / ED Diagnoses   Final diagnoses:  Odynophagia    ED Discharge Orders    None         Arlean Hopping 07/02/18 2325    Blanchie Dessert, MD 07/04/18 1459

## 2018-07-02 NOTE — ED Triage Notes (Signed)
He states he has hx of multiple visits to gastroenterology for esophageal issues; and has had multiple esophageal dilitations. 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 Pt. States he felt a "choking sensation" while eating; and shortly thereafter felt some central chest area pain. He states that the chest pain has somewhat abated. His skin is normal, warm and dry. He carries emesis bag and c/o constant nausea and is in no distress. EKG, CXR and labs performed at triage.

## 2018-07-03 ENCOUNTER — Encounter (HOSPITAL_COMMUNITY): Payer: Self-pay | Admitting: *Deleted

## 2018-07-03 ENCOUNTER — Encounter (HOSPITAL_COMMUNITY)
Admission: RE | Disposition: A | Discharge status: Home / Self Care | Payer: Self-pay | Source: Ambulatory Visit | Attending: Gastroenterology

## 2018-07-03 ENCOUNTER — Telehealth: Payer: Self-pay

## 2018-07-03 ENCOUNTER — Ambulatory Visit (HOSPITAL_COMMUNITY): Payer: BLUE CROSS/BLUE SHIELD | Admitting: Anesthesiology

## 2018-07-03 ENCOUNTER — Encounter (HOSPITAL_COMMUNITY): Payer: Self-pay

## 2018-07-03 ENCOUNTER — Other Ambulatory Visit: Payer: Self-pay

## 2018-07-03 ENCOUNTER — Emergency Department (HOSPITAL_COMMUNITY): Payer: BLUE CROSS/BLUE SHIELD

## 2018-07-03 ENCOUNTER — Other Ambulatory Visit: Payer: Self-pay | Admitting: Physician Assistant

## 2018-07-03 ENCOUNTER — Ambulatory Visit (HOSPITAL_COMMUNITY)
Admission: RE | Admit: 2018-07-03 | Discharge: 2018-07-03 | Disposition: A | Discharge status: Home / Self Care | Payer: BLUE CROSS/BLUE SHIELD | Source: Ambulatory Visit | Attending: Gastroenterology | Admitting: Gastroenterology

## 2018-07-03 DIAGNOSIS — K2210 Ulcer of esophagus without bleeding: Secondary | ICD-10-CM

## 2018-07-03 DIAGNOSIS — K222 Esophageal obstruction: Secondary | ICD-10-CM | POA: Insufficient documentation

## 2018-07-03 DIAGNOSIS — Z820 Family history of epilepsy and other diseases of the nervous system: Secondary | ICD-10-CM | POA: Insufficient documentation

## 2018-07-03 DIAGNOSIS — R1310 Dysphagia, unspecified: Secondary | ICD-10-CM

## 2018-07-03 DIAGNOSIS — G4730 Sleep apnea, unspecified: Secondary | ICD-10-CM | POA: Insufficient documentation

## 2018-07-03 DIAGNOSIS — Z8546 Personal history of malignant neoplasm of prostate: Secondary | ICD-10-CM | POA: Insufficient documentation

## 2018-07-03 DIAGNOSIS — I10 Essential (primary) hypertension: Secondary | ICD-10-CM | POA: Insufficient documentation

## 2018-07-03 DIAGNOSIS — Z79899 Other long term (current) drug therapy: Secondary | ICD-10-CM | POA: Insufficient documentation

## 2018-07-03 DIAGNOSIS — Z8042 Family history of malignant neoplasm of prostate: Secondary | ICD-10-CM | POA: Insufficient documentation

## 2018-07-03 DIAGNOSIS — Z800 Family history of malignant neoplasm of digestive organs: Secondary | ICD-10-CM | POA: Insufficient documentation

## 2018-07-03 HISTORY — PX: ESOPHAGOGASTRODUODENOSCOPY (EGD) WITH PROPOFOL: SHX5813

## 2018-07-03 SURGERY — ESOPHAGOGASTRODUODENOSCOPY (EGD) WITH PROPOFOL
Anesthesia: Monitor Anesthesia Care

## 2018-07-03 MED ORDER — SODIUM CHLORIDE 0.9 % IV SOLN: INTRAVENOUS | Status: DC

## 2018-07-03 MED ORDER — OXYCODONE-ACETAMINOPHEN 5-325 MG PO TABS: 1.0000 | ORAL_TABLET | ORAL | 0 refills | Status: DC | PRN

## 2018-07-03 MED ORDER — IOPAMIDOL (ISOVUE-300) INJECTION 61%
100.0000 mL | Freq: Once | INTRAVENOUS | Status: AC | PRN
  Administered 2018-07-03: 100 mL via INTRAVENOUS

## 2018-07-03 MED ORDER — IOPAMIDOL (ISOVUE-300) INJECTION 61%
INTRAVENOUS | Status: AC
  Filled 2018-07-03: qty 100

## 2018-07-03 MED ORDER — LANSOPRAZOLE 15 MG PO CPDR
15.0000 mg | DELAYED_RELEASE_CAPSULE | Freq: Two times a day (BID) | ORAL | 1 refills | Status: DC
Start: 2018-07-03 — End: 2019-09-22

## 2018-07-03 MED ORDER — SUCRALFATE 1 GM/10ML PO SUSP: 1.0000 g | Freq: Four times a day (QID) | ORAL | 1 refills | Status: DC

## 2018-07-03 MED ORDER — PROPOFOL 10 MG/ML IV BOLUS
INTRAVENOUS | Status: DC | PRN
  Administered 2018-07-03 (×2): 30 mg via INTRAVENOUS
  Administered 2018-07-03: 20 mg via INTRAVENOUS

## 2018-07-03 MED ORDER — LACTATED RINGERS IV SOLN
INTRAVENOUS | Status: DC
  Administered 2018-07-03: 13:00:00 via INTRAVENOUS

## 2018-07-03 MED ORDER — LIDOCAINE VISCOUS HCL 2 % MT SOLN: 10.0000 mL | OROMUCOSAL | 0 refills | Status: AC | PRN

## 2018-07-03 MED ORDER — PROPOFOL 10 MG/ML IV BOLUS
INTRAVENOUS | Status: AC
  Filled 2018-07-03: qty 60

## 2018-07-03 MED ORDER — FAMOTIDINE 20 MG PO TABS: 20.0000 mg | ORAL_TABLET | Freq: Two times a day (BID) | ORAL | 0 refills | Status: DC

## 2018-07-03 MED ORDER — OXYCODONE-ACETAMINOPHEN 5-325 MG PO TABS
1.0000 | ORAL_TABLET | Freq: Once | ORAL | Status: AC
  Administered 2018-07-03: 1 via ORAL
  Filled 2018-07-03: qty 1

## 2018-07-03 MED ORDER — PROPOFOL 500 MG/50ML IV EMUL
INTRAVENOUS | Status: DC | PRN
  Administered 2018-07-03: 100 ug/kg/min via INTRAVENOUS

## 2018-07-03 MED ORDER — FAMOTIDINE 20 MG PO TABS
20.0000 mg | ORAL_TABLET | Freq: Once | ORAL | Status: AC
  Administered 2018-07-03: 20 mg via ORAL
  Filled 2018-07-03: qty 1

## 2018-07-03 MED FILL — LIDOCAINE 2% VISCOUS SOLN: 2 | 7 days supply | Qty: 500 | Fill #0

## 2018-07-03 MED FILL — LANSOPRAZOLE DR 15 MG CAP: 15 | 30 days supply | Qty: 60 | Fill #0

## 2018-07-03 SURGICAL SUPPLY — 15 items

## 2018-07-03 NOTE — Op Note (Signed)
[facility] Patient Name: Logan Contreras Procedure Date: 07/03/2018 MRN: [id number] Attending MD: Carlota Raspberry. Havery Moros , MD Date of Birth: February 14, 1961 CSN: [id number] Age: 57 Admit Type: Outpatient Procedure:                Upper GI endoscopy Indications:              Dysphagia, Odynophagia - transient impaction with                            crab meat last night, now with ongoing odynophagia,                            rule out foreign body Providers:                Carlota Raspberry. Havery Moros, MD, Kingsley Plan, RN,                            Elspeth Cho Tech., Technician, Derrek Gu. Alday                            CRNA, CRNA Referring MD:              Medicines:                Monitored Anesthesia Care Complications:            No immediate complications. Estimated blood loss:                            None. Estimated Blood Loss:     Estimated blood loss: none. Procedure:                Pre-Anesthesia Assessment:                           - Prior to the procedure, a History and Physical                            was performed, and patient medications and                            allergies were reviewed. The patient's tolerance of                            previous anesthesia was also reviewed. The risks                            and benefits of the procedure and the sedation                            options and risks were discussed with the patient.                            All questions were answered, and informed consent                            was obtained.  Prior Anticoagulants: The patient has                            taken no previous anticoagulant or antiplatelet                            agents. ASA Grade Assessment: II - A patient with                            mild systemic disease. After reviewing the risks                            and benefits, the patient was deemed in                            satisfactory condition to undergo the  procedure.                           After obtaining informed consent, the endoscope was                            passed under direct vision. Throughout the                            procedure, the patient's blood pressure, pulse, and                            oxygen saturations were monitored continuously. The                            [id number]-[id number] ([id number] Olympus adult endoscope was                            introduced through the mouth, and advanced to the                            second part of duodenum. The upper GI endoscopy was                            accomplished without difficulty. The patient                            tolerated the procedure well. Scope In: Scope Out: Findings:      Esophagogastric landmarks were identified: the Z-line was found at 40       cm, the gastroesophageal junction was found at 40 cm and the upper       extent of the gastric folds was found at 40 cm from the incisors.      One benign-appearing, intrinsic severe (stenosis; an endoscope cannot       pass) stenosis was found 40 cm from the incisors, with associated       ulceration. This stenosis measured less than one cm (in length). Lumen <       67m in size. The adult endoscope was withdrawn and a pediatric upper  endoscope was placed to traverse the stricture. Dilation not performed       given ulceration adjacent to the stricture.      One deep linear esophageal ulcer was found 30 to 40 cm from the       incisors, extending down into the GEJ. Multiple passes made, no evidence       of any retained foreign body appreciated.      The exam of the esophagus was otherwise normal.      The entire examined stomach was normal.      The duodenal bulb and second portion of the duodenum were normal. Impression:               - Esophagogastric landmarks identified.                           - Benign-appearing esophageal stenosis with                            ulceration, not dilated due to  ulceration noted                           - Large linear esophageal ulcer - unclear if this                            was due to transient retained foreign body versus                            severe impaction changes which passed on its own,                            no evidence of retained foreign body on this exam.                           - Normal stomach.                           - Normal duodenal bulb and second portion of the                            duodenum.                           - No evidence of retained foreign body in the upper                            tract Moderate Sedation:      No moderate sedation, case performed with MAC Recommendation:           - Patient has a contact number available for                            emergencies. The signs and symptoms of potential                            delayed complications were discussed with the  patient. Return to normal activities tomorrow.                            Written discharge instructions were provided to the                            patient.                           - Maintain on complete liquid diet.                           - Start Prevacid solutab 59m twice daily                           - Start liquid carafate 10cc po q 6 hrs PRN pain                           - Start viscous lidocaine PO q 4-6 hrs PRN                           - Repeat EGD in a few weeks and dilate stricture                            once ulcer is healed                           - Avoid other PO medications at this time Procedure Code(s):        --- Professional ---                           [id number] Esophagogastroduodenoscopy, flexible,                            transoral; diagnostic, including collection of                            specimen(s) by brushing or washing, when performed                            (separate procedure) Diagnosis Code(s):        --- Professional ---                            K22.2, Esophageal obstruction                           K22.10, Ulcer of esophagus without bleeding                           R13.10, Dysphagia, unspecified CPT copyright 2017 [employer]. All rights reserved. The codes documented in this report are preliminary and upon coder review may  be revised to meet current compliance requirements. SRemo LippsP. Armbruster, MD 07/03/2018 1:37:59 PM This report has been signed electronically. Number of Addenda: 0

## 2018-07-03 NOTE — ED Provider Notes (Signed)
Assumed care from PA Gibbons at shift change.  See prior notes for full H&P.  Briefly, 57 year old male with history of esophageal strictures requiring dilatation in 2010, here with sternal pain.  Pain around 6 PM after eating a crab cake sandwich and felt like it got stuck.  States drank some water and felt like it went down further but continues to have some pain.  Work-up here thus far is overall reassuring, troponin negative.  EKG is nonischemic.  Patient initially had some relief with GI cocktail, but reported worsening pain again on recheck.  [place]: Additional meds to be given.  CT of neck and chest pending to rule out signs of obstruction or perforation.  If negative, can likely discharge.  GI has already been consulted, they will call patient in the morning to schedule follow-up.  Results for orders placed or performed during the hospital encounter of 79/39/03  Basic metabolic panel  Result Value Ref Range   Sodium 140 135 - 145 mmol/L   Potassium 4.3 3.5 - 5.1 mmol/L   Chloride 102 98 - 111 mmol/L   CO2 29 22 - 32 mmol/L   Glucose, Bld 97 70 - 99 mg/dL   BUN 15 6 - 20 mg/dL   Creatinine, Ser 1.19 0.61 - 1.24 mg/dL   Calcium 9.3 8.9 - 10.3 mg/dL   GFR calc non Af Amer >60 >60 mL/min   GFR calc Af Amer >60 >60 mL/min   Anion gap 9 5 - 15  CBC  Result Value Ref Range   WBC 9.8 4.0 - 10.5 K/uL   RBC 5.37 4.22 - 5.81 MIL/uL   Hemoglobin 15.3 13.0 - 17.0 g/dL   HCT 44.5 39.0 - 52.0 %   MCV 82.9 78.0 - 100.0 fL   MCH 28.5 26.0 - 34.0 pg   MCHC 34.4 30.0 - 36.0 g/dL   RDW 13.3 11.5 - 15.5 %   Platelets 225 150 - 400 K/uL  Troponin I  Result Value Ref Range   Troponin I <0.03 <0.03 ng/mL  Lipase, blood  Result Value Ref Range   Lipase 66 (H) 11 - 51 U/L  Hepatic function panel  Result Value Ref Range   Total Protein 7.2 6.5 - 8.1 g/dL   Albumin 4.2 3.5 - 5.0 g/dL   AST 36 15 - 41 U/L   ALT 35 0 - 44 U/L   Alkaline Phosphatase 74 38 - 126 U/L   Total Bilirubin 0.9 0.3 - 1.2  mg/dL   Bilirubin, Direct 0.2 0.0 - 0.2 mg/dL   Indirect Bilirubin 0.7 0.3 - 0.9 mg/dL   Dg Chest 2 View  Result Date: 07/02/2018 CLINICAL DATA:  Chest pain EXAM: CHEST - 2 VIEW COMPARISON:  01/11/2017 FINDINGS: Heart and mediastinal contours are within normal limits. No focal opacities or effusions. No acute bony abnormality. IMPRESSION: No active cardiopulmonary disease. Electronically Signed   By: Rolm Baptise M.D.   On: 07/02/2018 19:06   Ct Soft Tissue Neck W Contrast  Result Date: 07/03/2018 CLINICAL DATA:  Choking sensation while eating. Assess for foreign body or stricture. History of multiple esophageal fistulous and dilatation. EXAM: CT NECK WITH CONTRAST TECHNIQUE: Multidetector CT imaging of the neck was performed using the standard protocol following the bolus administration of intravenous contrast. CONTRAST:  126mL ISOVUE-300 IOPAMIDOL (ISOVUE-300) INJECTION 61% COMPARISON:  None. FINDINGS: PHARYNX AND LARYNX: Normal.  Widely patent airway. SALIVARY GLANDS: Normal. THYROID: Normal. LYMPH NODES: No lymphadenopathy by CT size criteria. VASCULAR: Normal. LIMITED INTRACRANIAL:  Normal. VISUALIZED ORBITS: Normal. MASTOIDS AND VISUALIZED PARANASAL SINUSES: Well-aerated. SKELETON: Nonacute. Approximate tooth 29 periapical abscess. 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 UPPER CHEST: Lung apices are clear. No superior mediastinal lymphadenopathy. OTHER: None. IMPRESSION: No acute process in the neck. Patent airway. No radiopaque foreign bodies. Electronically Signed   By: Elon Alas M.D.   On: 07/03/2018 00:51   Ct Chest W Contrast  Result Date: 07/03/2018 CLINICAL DATA:  Choking sensation, chest pain EXAM: CT CHEST WITH CONTRAST TECHNIQUE: Multidetector CT imaging of the chest was performed during intravenous contrast administration. CONTRAST:  171mL ISOVUE-300 IOPAMIDOL (ISOVUE-300) INJECTION 61% COMPARISON:  Chest radiographs dated 07/02/2017  FINDINGS: Cardiovascular: Heart is normal in size.  No pericardial effusion. No evidence of thoracic aortic aneurysm. Mild atherosclerotic calcifications of the aortic arch. Mediastinum/Nodes: No suspicious mediastinal, hilar, or axillary lymphadenopathy. Visualized thyroid is unremarkable. Esophagus is grossly unremarkable. No evidence of stricture. No radiopaque foreign body is seen. Lungs/Pleura: Lungs are clear. No suspicious pulmonary nodules. No focal consolidation. No pleural effusion or pneumothorax. Upper Abdomen: Visualized upper abdomen is grossly unremarkable. Musculoskeletal: Mild degenerative changes of the mid thoracic spine. IMPRESSION: No CT findings to suggest esophageal stricture. No radiopaque foreign body is seen. No evidence of acute cardiopulmonary disease. Aortic Atherosclerosis (ICD10-I70.0). Electronically Signed   By: Julian Hy M.D.   On: 07/03/2018 01:03   CT is without any acute findings.  On reassessment patient is initially sleeping but awoken he begins complaining of pain again.  Reports to his midsternal region.  States pain has been coming and going, worse if he moves around or belches.  States it does have somewhat of a burning sensation.  He continues drinking water without issue does not appear to have an obstruction.  He is not currently on any medications for acid reflux at home, will start trial of Pepcid and have him follow-up closely with GI doctor.  I do not suspect that this is atypical ACS, PE, dissection, acute cardiac event as this does appear to be GI related.  Patient and wife comfortable with d/c home, follow-up is scheduled.  They will return here for any new/acute changes.   Larene Pickett, PA-C 07/03/18 0319    Molpus, Jenny Reichmann, MD 07/03/18 [phone]

## 2018-07-03 NOTE — Telephone Encounter (Signed)
-----   Message from Yetta Flock, MD sent at 07/03/2018  7:27 AM EDT ----- Regarding: ER Follow up Got called by the ED about this patient last night. Initially had some dysphagia with eating something and then developed pain with swallowing. They said symptoms improved with GI cocktail and was tolerating PO, feeling better, and sent him home.  Almyra Free can you call him this morning and see how he is doing? If he's improving with liquid carafate and pepsid that's reassuring. If he's having ongoing odynophagia with difficulty swallowingm and not improving I will need to add him on for an urgent EGD. Can you let me know? Thanks

## 2018-07-03 NOTE — Telephone Encounter (Signed)
Called patient's home number (only one available) had to lvm, asked that he call our office back. I was checking on him to see how he's doing. I will try again later.

## 2018-07-03 NOTE — Transfer of Care (Signed)
Immediate Anesthesia Transfer of Care Note  Patient: Logan Contreras  Procedure(s) Performed: ESOPHAGOGASTRODUODENOSCOPY (EGD) WITH PROPOFOL (N/A )  Patient Location: PACU  Anesthesia Type:MAC  Level of Consciousness: sedated  Airway & Oxygen Therapy: Patient Spontanous Breathing and Patient connected to nasal cannula oxygen  Post-op Assessment: Report given to RN and Post -op Vital signs reviewed and stable  Post vital signs: Reviewed and stable  Last Vitals:  Vitals Value Taken Time  BP    Temp    Pulse    Resp    SpO2      Last Pain:  Vitals:   07/03/18 1240  TempSrc: Oral  PainSc: 0-No pain         Complications: No apparent anesthesia complications

## 2018-07-03 NOTE — Consult Note (Signed)
Consultation  Referring Provider:     Quincy Carnes PA Primary Care Physician:  Patient, No Pcp Per Primary Gastroenterologist:       NA  Reason for Consultation:     odynophagia         HPI:   Logan Contreras is a 57 y.o. male with a history of reported esophageal stricture and periodic dysphagia, presenting with odynophagia today.  He was in the ER last night after eating crab cakes. States he felt the crab get "stuck" for a period of time. He drank liquids to push it down, at which time he felt severe pain in his lower chest. The discomfort persisted with swallowing anything so went to the ED last night. There he was given a GI cocktail which helped his symptoms. He was tolerating liquids and was discharged home given he was feeling better. He had a CT chest and CXR apparently which were normal. I was notified about his case. We contacted him this AM, he is fearful of advancing his diet and continues to have some odynophagia with swallows, but states it is better since last night. He has no discomfort if he does not swallow. Here for EGD today to ensure no retained foreign body given his symptoms.  Past Medical History:  Diagnosis Date  . 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 Hypertension   . Morbid obesity ([place])   . Sleep apnea     Past Surgical History:  Procedure Laterality Date  . CARPAL TUNNEL RELEASE    . ESOPHAGEAL DILATION    . KNEE SURGERY    . PROSTATE BIOPSY  09/09/14   Gleason 6, vol 42.22 cc    Family History  Problem Relation Age of Onset  . Dementia Mother   . Alzheimer's disease Father   . Cancer Father        prostate, surgery  . Cancer Brother        stomach  . Pancreatic cancer Sister      Social History   Tobacco Use  . Smoking status: Never Smoker  . Smokeless tobacco: Never Used  . Tobacco comment: smoked a little in high school  Substance Use Topics  . Alcohol use: Yes    Alcohol/week: 0.0 oz    Comment:  socially  . Drug use: No    Prior to Admission medications   Medication Sig Start Date End Date Taking? Authorizing Provider  famotidine (PEPCID) 20 MG tablet Take 1 tablet (20 mg total) by mouth 2 (two) times daily. 07/03/18  Yes Larene Pickett, PA-C  olmesartan-hydrochlorothiazide (BENICAR HCT) 40-25 MG tablet Take 1 tablet by mouth daily. 03/27/18  Yes Robyn Haber, MD  oxyCODONE-acetaminophen (PERCOCET) 5-325 MG tablet Take 1 tablet by mouth every 4 (four) hours as needed. 07/03/18  Yes Larene Pickett, PA-C    Current Facility-Administered Medications  Medication Dose Route Frequency Provider Last Rate Last Dose  . lactated ringers infusion   Intravenous Continuous Peyson Delao, Carlota Raspberry, MD        Allergies as of 07/03/2018  . (No Known Allergies)     Review of Systems:    As per HPI, otherwise negative    Physical Exam:  Vital signs in last 24 hours: Temp:  [99.8 F (37.7 C)] 99.8 F (37.7 C) (07/29 1240) Pulse Rate:  [85] 85 (07/29 1240) Resp:  [24] 24 (07/29 1240) BP: (136)/(58) 136/58 (07/29 1240) SpO2:  [98 %] 98 % (  07/29 1240) Weight:  [270 lb (122.5 kg)] 270 lb (122.5 kg) (07/29 1240)   General:   Pleasant male in NAD Head:  Normocephalic and atraumatic. Eyes:   No icterus.   Conjunctiva pink. Ears:  Normal auditory acuity. Neck:  Supple Lungs:  respirations even and unlabored. Lungs clear to auscultation bilaterally.  No crepitus Heart:  Regular rate and rhythm; no MRG Abdomen:  Soft, nondistended, nontender. No appreciable masses or hepatomegaly.  Msk:  Symmetrical without gross deformities.  Extremities:  Without edema. Neurologic:  Alert and  oriented x4;  grossly normal neurologically. Skin:  Intact without significant lesions or rashes. Psych:  Alert and cooperative. Normal affect.  LAB RESULTS: Recent Labs    07/02/18 2100  WBC 9.8  HGB 15.3  HCT 44.5  PLT 225   BMET Recent Labs    07/02/18 2100  NA 140  K 4.3  CL 102  CO2 29    GLUCOSE 97  BUN 15  CREATININE 1.19  CALCIUM 9.3   LFT Recent Labs    07/02/18 2100  PROT 7.2  ALBUMIN 4.2  AST 36  ALT 35  ALKPHOS 74  BILITOT 0.9  BILIDIR 0.2  IBILI 0.7   PT/INR No results for input(s): LABPROT, INR in the last 72 hours.  STUDIES: Dg Chest 2 View  Result Date: 07/02/2018 CLINICAL DATA:  Chest pain EXAM: CHEST - 2 VIEW COMPARISON:  01/11/2017 FINDINGS: Heart and mediastinal contours are within normal limits. No focal opacities or effusions. No acute bony abnormality. IMPRESSION: No active cardiopulmonary disease. Electronically Signed   By: Rolm Baptise M.D.   On: 07/02/2018 19:06   Ct Soft Tissue Neck W Contrast  Result Date: 07/03/2018 CLINICAL DATA:  Choking sensation while eating. Assess for foreign body or stricture. History of multiple esophageal fistulous and dilatation. EXAM: CT NECK WITH CONTRAST TECHNIQUE: Multidetector CT imaging of the neck was performed using the standard protocol following the bolus administration of intravenous contrast. CONTRAST:  170mL ISOVUE-300 IOPAMIDOL (ISOVUE-300) INJECTION 61% COMPARISON:  None. FINDINGS: PHARYNX AND LARYNX: Normal.  Widely patent airway. SALIVARY GLANDS: Normal. THYROID: Normal. LYMPH NODES: No lymphadenopathy by CT size criteria. VASCULAR: Normal. LIMITED INTRACRANIAL: Normal. VISUALIZED ORBITS: Normal. MASTOIDS AND VISUALIZED PARANASAL SINUSES: Well-aerated. SKELETON: Nonacute. Approximate tooth 29 periapical abscess. 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 UPPER CHEST: Lung apices are clear. No superior mediastinal lymphadenopathy. OTHER: None. IMPRESSION: No acute process in the neck. Patent airway. No radiopaque foreign bodies. Electronically Signed   By: Elon Alas M.D.   On: 07/03/2018 00:51   Ct Chest W Contrast  Result Date: 07/03/2018 CLINICAL DATA:  Choking sensation, chest pain EXAM: CT CHEST WITH CONTRAST TECHNIQUE: Multidetector CT imaging of  the chest was performed during intravenous contrast administration. CONTRAST:  126mL ISOVUE-300 IOPAMIDOL (ISOVUE-300) INJECTION 61% COMPARISON:  Chest radiographs dated 07/02/2017 FINDINGS: Cardiovascular: Heart is normal in size.  No pericardial effusion. No evidence of thoracic aortic aneurysm. Mild atherosclerotic calcifications of the aortic arch. Mediastinum/Nodes: No suspicious mediastinal, hilar, or axillary lymphadenopathy. Visualized thyroid is unremarkable. Esophagus is grossly unremarkable. No evidence of stricture. No radiopaque foreign body is seen. Lungs/Pleura: Lungs are clear. No suspicious pulmonary nodules. No focal consolidation. No pleural effusion or pneumothorax. Upper Abdomen: Visualized upper abdomen is grossly unremarkable. Musculoskeletal: Mild degenerative changes of the mid thoracic spine. IMPRESSION: No CT findings to suggest esophageal stricture. No radiopaque foreign body is seen. No evidence of acute cardiopulmonary disease. Aortic Atherosclerosis (ICD10-I70.0). Electronically Signed  By: Julian Hy M.D.   On: 07/03/2018 01:03       Impression / Plan:  57 y/o male with a history of periodic dysphagia, presenting with odynophagia after a near impaction last night while eating Crab cakes. Reportedly was better after a GI cocktail and discharged from the ED, we followed up this AM and he is fearful of advancing his diet due to symptoms, although overall he feels much improved from last night. EGD recommended to further evaluate, will rule out retained foreign body (fish bone?) although I suspect he may more likely have ulceration from the transient impaction. I discussed risks / benefits of EGD and anesthesia, he wanted to proceed. Further recommendations pending the results.  [place] Cellar, MD [facility]

## 2018-07-03 NOTE — Anesthesia Preprocedure Evaluation (Signed)
Anesthesia Evaluation  Patient identified by MRN, date of birth, ID band Patient awake    Reviewed: Allergy & Precautions, H&P , NPO status , Patient's Chart, lab work & pertinent test results  Airway Mallampati: II   Neck ROM: full    Dental   Pulmonary sleep apnea ,    breath sounds clear to auscultation       Cardiovascular hypertension,  Rhythm:regular Rate:Normal     Neuro/Psych    GI/Hepatic   Endo/Other  Morbid obesity  Renal/GU      Musculoskeletal   Abdominal   Peds  Hematology   Anesthesia Other Findings   Reproductive/Obstetrics                             Anesthesia Physical Anesthesia Plan  ASA: II  Anesthesia Plan: MAC   Post-op Pain Management:    Induction: Intravenous  PONV Risk Score and Plan: 1 and Ondansetron, Propofol infusion and Treatment may vary due to age or medical condition  Airway Management Planned: Nasal Cannula  Additional Equipment:   Intra-op Plan:   Post-operative Plan:   Informed Consent: I have reviewed the patients History and Physical, chart, labs and discussed the procedure including the risks, benefits and alternatives for the proposed anesthesia with the patient or authorized representative who has indicated his/her understanding and acceptance.     Plan Discussed with: CRNA, Anesthesiologist and Surgeon  Anesthesia Plan Comments:         Anesthesia Quick Evaluation

## 2018-07-03 NOTE — Telephone Encounter (Signed)
Thanks for letting me know, sorry to hear this. He was feeling better in the ER after GI cocktail. Given inability to progress or fear of trying further PO, recommend EGD today. Thanks for your help, he is scheduled at [facility] unit to be done shortly.

## 2018-07-03 NOTE — Discharge Instructions (Signed)
Take the prescribed medication as directed. Follow-up with GI office-- they will be calling you in the morning to schedule appt. Return to the ED for new or worsening symptoms.

## 2018-07-03 NOTE — Telephone Encounter (Signed)
Patient called back, he states that he is still having pain upon swallowing. He has only taken in water, no food. Patient said that he felt a little bit better after having the GI cocktail in the ED, now pain is worse than when released.

## 2018-07-03 NOTE — Discharge Instructions (Signed)
YOU HAD AN ENDOSCOPIC PROCEDURE TODAY: Refer to the procedure report and other information in the discharge instructions given to you for any specific questions about what was found during the examination. If this information does not answer your questions, please call [place] office at [phone] to clarify.  ° °YOU SHOULD EXPECT: Some feelings of bloating in the abdomen. Passage of more gas than usual. Walking can help get rid of the air that was put into your GI tract during the procedure and reduce the bloating. If you had a lower endoscopy (such as a colonoscopy or flexible sigmoidoscopy) you may notice spotting of blood in your stool or on the toilet paper. Some abdominal soreness may be present for a day or two, also. ° °DIET: Your first meal following the procedure should be a light meal and then it is ok to progress to your normal diet. A half-sandwich or bowl of soup is an example of a good first meal. Heavy or fried foods are harder to digest and may make you feel nauseous or bloated. Drink plenty of fluids but you should avoid alcoholic beverages for 24 hours. If you had a esophageal dilation, please see attached instructions for diet.   ° °ACTIVITY: Your care partner should take you home directly after the procedure. You should plan to take it easy, moving slowly for the rest of the day. You can resume normal activity the day after the procedure however YOU SHOULD NOT DRIVE, use power tools, machinery or perform tasks that involve climbing or major physical exertion for 24 hours (because of the sedation medicines used during the test).  ° °SYMPTOMS TO REPORT IMMEDIATELY: °A gastroenterologist can be reached at any hour. Please call [phone]  for any of the following symptoms:  °Following lower endoscopy (colonoscopy, flexible sigmoidoscopy) °Excessive amounts of blood in the stool  °Significant tenderness, worsening of abdominal pains  °Swelling of the abdomen that is new, acute  °Fever of 100° or  higher  °Following upper endoscopy (EGD, EUS, ERCP, esophageal dilation) °Vomiting of blood or coffee ground material  °New, significant abdominal pain  °New, significant chest pain or pain under the shoulder blades  °Painful or persistently difficult swallowing  °New shortness of breath  °Black, tarry-looking or red, bloody stools ° °FOLLOW UP:  °If any biopsies were taken you will be contacted by phone or by letter within the next 1-3 weeks. Call [phone]  if you have not heard about the biopsies in 3 weeks.  °Please also call with any specific questions about appointments or follow up tests. ° °

## 2018-07-04 ENCOUNTER — Telehealth: Payer: Self-pay | Admitting: Gastroenterology

## 2018-07-04 ENCOUNTER — Encounter (HOSPITAL_COMMUNITY): Payer: Self-pay | Admitting: Gastroenterology

## 2018-07-04 MED FILL — CARAFATE 1 GM/10 ML SUSP: 1 | 10 days supply | Qty: 420 | Fill #0

## 2018-07-04 NOTE — Telephone Encounter (Signed)
Spoke to patient, he is doing better today. Remains on a liquid diet. He was not able to pick up the liquid medication yet, but is planning on getting it this afternoon. Relayed to patient the plan for repeat EGD with dilation on 8/22. He is going to see if he can get this off at work as he works 4:00 am to 1:00 pm daily. He will call back to let me know about scheduling. I am not sure about next available after appointment but will also look for that time frame.

## 2018-07-04 NOTE — Telephone Encounter (Signed)
Thanks Julie

## 2018-07-04 NOTE — Anesthesia Postprocedure Evaluation (Signed)
Anesthesia Post Note  Patient: Logan Contreras  Procedure(s) Performed: ESOPHAGOGASTRODUODENOSCOPY (EGD) WITH PROPOFOL (N/A )     Patient location during evaluation: Endoscopy Anesthesia Type: MAC Level of consciousness: awake and alert Pain management: pain level controlled Vital Signs Assessment: post-procedure vital signs reviewed and stable Respiratory status: spontaneous breathing, nonlabored ventilation, respiratory function stable and patient connected to nasal cannula oxygen Cardiovascular status: blood pressure returned to baseline and stable Postop Assessment: no apparent nausea or vomiting Anesthetic complications: no    Last Vitals:  Vitals:   07/03/18 1330 07/03/18 1345  BP: 110/67 (!) 114/58  Pulse:  79  Resp: (!) 33 18  Temp:    SpO2: 100% 100%    Last Pain:  Vitals:   07/03/18 1345  TempSrc:   PainSc: 0-No pain                 Wilman Tucker S

## 2018-07-04 NOTE — Telephone Encounter (Signed)
Patient underwent EGD yesterday for odynophagia - he had a transient food impaction the day prior. Severe stricture at the GEJ with large ulceration at the distal esophagus without evidence of any retained foreign body. Unclear if this was just due to prior food impaction of if he passed a retained foreign body. GEJ stricture not dilated given ulceration at the site.    Given liquid carafate, viscous lidocaine, and prevacid solutab. I think he warrants a repeat EGD with dilation in a few weeks after his ulceration has healed. He will need to be on a liquid / soft diet until that time.   Almyra Free can you see if he has been able to pick up his medications and that he is feeling well. If you can schedule him for an EGD on 8/22 at 0730 AM that is my first opening and would be a good time for his follow up exam with dilation. I don't think he had a retained foreign body but if he develops abdominal pains moving forward he needs to let us know. Thanks

## 2018-07-07 NOTE — Telephone Encounter (Signed)
Spoke to patient again today checking to see if the 8/22 will work for scheduling an EGD. He states he has not had a chance to verify with work and he will call back next week.

## 2019-04-10 ENCOUNTER — Ambulatory Visit (HOSPITAL_COMMUNITY)
Admission: EM | Admit: 2019-04-10 | Discharge: 2019-04-10 | Disposition: A | Discharge status: Home / Self Care | Payer: BLUE CROSS/BLUE SHIELD | Attending: Family Medicine | Admitting: Family Medicine

## 2019-04-10 ENCOUNTER — Other Ambulatory Visit: Payer: Self-pay

## 2019-04-10 ENCOUNTER — Encounter (HOSPITAL_COMMUNITY): Payer: Self-pay

## 2019-04-10 DIAGNOSIS — I10 Essential (primary) hypertension: Secondary | ICD-10-CM

## 2019-04-10 MED ORDER — OLMESARTAN MEDOXOMIL-HCTZ 40-25 MG PO TABS: 1.0000 | ORAL_TABLET | Freq: Every day | ORAL | 0 refills | Status: DC

## 2019-04-10 NOTE — ED Triage Notes (Signed)
Ran out of Bp meds 2 days ago, that was prescribed here last year, has not established pcp

## 2019-04-10 NOTE — ED Provider Notes (Signed)
Bernice   [id number] 04/10/19 Arrival Time: 1601  ASSESSMENT & PLAN:  1. Essential hypertension     Meds ordered this encounter  Medications  . olmesartan-hydrochlorothiazide (BENICAR HCT) 40-25 MG tablet    Sig: Take 1 tablet by mouth daily.    Dispense:  90 tablet    Refill:  0   Stressed importance of establishing care with a PCP.  May f/u here as needed. Reviewed expectations re: course of current medical issues. Questions answered. Outlined signs and symptoms indicating need for more acute intervention. Patient verbalized understanding. After Visit Summary given.   SUBJECTIVE:  Logan Contreras is a 58 y.o. male who presents with concerns regarding increased blood pressures. Reports out of medicine for two days. Concerning blood pressures; "When I check it, it's usually ok." He reports taking medications as instructed, no medication side effects noted, no chest pain on exertion, no dyspnea on exertion, no swelling of ankles, no orthostatic dizziness or lightheadedness, no orthopnea or paroxysmal nocturnal dyspnea, no palpitations and no erectile dysfunction.  Social History   Tobacco Use  Smoking Status Never Smoker  Smokeless Tobacco Never Used  Tobacco Comment   smoked a little in high school   ROS: As per HPI. All other systems negative.   OBJECTIVE:  Vitals:   04/10/19 1334  BP: (!) 161/78  Pulse: 72  Resp: 18  Temp: 98.2 F (36.8 C)  SpO2: 98%    General appearance: alert; no distress Eyes: PERRLA; EOMI HENT: normocephalic; atraumatic Neck: supple Lungs: clear to auscultation bilaterally Heart: regular rate and rhythm without murmer Abdomen: soft, non-tender; bowel sounds normal Extremities: no edema; symmetrical with no gross deformities Skin: warm and dry Psychological: alert and cooperative; normal mood and affect  Labs Reviewed: Results for orders placed or performed during the hospital encounter of 09/32/35  Basic metabolic  panel  Result Value Ref Range   Sodium 140 135 - 145 mmol/L   Potassium 4.3 3.5 - 5.1 mmol/L   Chloride 102 98 - 111 mmol/L   CO2 29 22 - 32 mmol/L   Glucose, Bld 97 70 - 99 mg/dL   BUN 15 6 - 20 mg/dL   Creatinine, Ser 1.19 0.61 - 1.24 mg/dL   Calcium 9.3 8.9 - 10.3 mg/dL   GFR calc non Af Amer >60 >60 mL/min   GFR calc Af Amer >60 >60 mL/min   Anion gap 9 5 - 15  CBC  Result Value Ref Range   WBC 9.8 4.0 - 10.5 K/uL   RBC 5.37 4.22 - 5.81 MIL/uL   Hemoglobin 15.3 13.0 - 17.0 g/dL   HCT 44.5 39.0 - 52.0 %   MCV 82.9 78.0 - 100.0 fL   MCH 28.5 26.0 - 34.0 pg   MCHC 34.4 30.0 - 36.0 g/dL   RDW 13.3 11.5 - 15.5 %   Platelets 225 150 - 400 K/uL  Troponin I  Result Value Ref Range   Troponin I <0.03 <0.03 ng/mL  Lipase, blood  Result Value Ref Range   Lipase 66 (H) 11 - 51 U/L  Hepatic function panel  Result Value Ref Range   Total Protein 7.2 6.5 - 8.1 g/dL   Albumin 4.2 3.5 - 5.0 g/dL   AST 36 15 - 41 U/L   ALT 35 0 - 44 U/L   Alkaline Phosphatase 74 38 - 126 U/L   Total Bilirubin 0.9 0.3 - 1.2 mg/dL   Bilirubin, Direct 0.2 0.0 - 0.2 mg/dL   Indirect  Bilirubin 0.7 0.3 - 0.9 mg/dL    No Known Allergies  Past Medical History:  Diagnosis Date  . 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 Hypertension   . Morbid obesity ([place])   . Sleep apnea    Social History   Socioeconomic History  . Marital status: Married    Spouse name: Not on file  . Number of children: 5  . Years of education: Not on file  . Highest education level: Not on file  Occupational History    Employer: [employer]  . Financial resource strain: Not on file  . Food insecurity:    Worry: Not on file    Inability: Not on file  . Transportation needs:    Medical: Not on file    Non-medical: Not on file  Tobacco Use  . Smoking status: Never Smoker  . Smokeless tobacco: Never Used  . Tobacco comment: smoked a little in high school  Substance and Sexual  Activity  . Alcohol use: Yes    Alcohol/week: 0.0 standard drinks    Comment: socially  . Drug use: No  . Sexual activity: Not on file  Lifestyle  . Physical activity:    Days per week: Not on file    Minutes per session: Not on file  . Stress: Not on file  Relationships  . Social connections:    Talks on phone: Not on file    Gets together: Not on file    Attends religious service: Not on file    Active member of club or organization: Not on file    Attends meetings of clubs or organizations: Not on file    Relationship status: Not on file  . Intimate partner violence:    Fear of current or ex partner: Not on file    Emotionally abused: Not on file    Physically abused: Not on file    Forced sexual activity: Not on file  Other Topics Concern  . Not on file  Social History Narrative  . Not on file   Family History  Problem Relation Age of Onset  . Dementia Mother   . Alzheimer's disease Father   . Cancer Father        prostate, surgery  . Cancer Brother        stomach  . Pancreatic cancer Sister    Past Surgical History:  Procedure Laterality Date  . CARPAL TUNNEL RELEASE    . ESOPHAGEAL DILATION    . ESOPHAGOGASTRODUODENOSCOPY (EGD) WITH PROPOFOL N/A 07/03/2018   Procedure: ESOPHAGOGASTRODUODENOSCOPY (EGD) WITH PROPOFOL;  Surgeon: Yetta Flock, MD;  Location: [facility];  Service: Gastroenterology;  Laterality: N/A;  . KNEE SURGERY    . PROSTATE BIOPSY  09/09/14   Gleason 6, vol 42.22 cc      Vanessa Kick, MD 04/10/19 1349

## 2019-04-16 ENCOUNTER — Other Ambulatory Visit: Payer: Self-pay

## 2019-04-16 ENCOUNTER — Encounter (HOSPITAL_COMMUNITY): Payer: Self-pay

## 2019-04-16 ENCOUNTER — Ambulatory Visit (HOSPITAL_COMMUNITY)
Admission: EM | Admit: 2019-04-16 | Discharge: 2019-04-16 | Disposition: A | Discharge status: Home / Self Care | Payer: BLUE CROSS/BLUE SHIELD | Attending: Family Medicine | Admitting: Family Medicine

## 2019-04-16 DIAGNOSIS — L600 Ingrowing nail: Secondary | ICD-10-CM

## 2019-04-16 DIAGNOSIS — L03032 Cellulitis of left toe: Secondary | ICD-10-CM

## 2019-04-16 MED ORDER — MUPIROCIN 2 % EX OINT: 1.0000 "application " | TOPICAL_OINTMENT | Freq: Two times a day (BID) | CUTANEOUS | 0 refills | Status: DC

## 2019-04-16 MED ORDER — CEPHALEXIN 500 MG PO CAPS: 500.0000 mg | ORAL_CAPSULE | Freq: Four times a day (QID) | ORAL | 0 refills | Status: AC

## 2019-04-16 MED ORDER — IBUPROFEN 800 MG PO TABS: 800.0000 mg | ORAL_TABLET | Freq: Three times a day (TID) | ORAL | 0 refills | Status: DC

## 2019-04-16 NOTE — Discharge Instructions (Signed)
Begin Keflex 4 times a day for the next week Soak toe/foot in warm water twice daily for approximately 20 minutes.  Dry well and apply Bactroban afterwards  May try using dental floss to encourage nail out from skin  If having continued issues please follow-up here or with podiatry, contact information below

## 2019-04-16 NOTE — ED Provider Notes (Signed)
[place]    CSN: [id number] Arrival date & time: 04/16/19  1007     History   Chief Complaint Chief Complaint  Patient presents with  . Toe Pain    HPI Patrich Heinze is a 58 y.o. male history of hypertension, sleep apnea presenting today for evaluation of ingrown toenail.  Patient states that over the past week he has developed increased pain and swelling to his left great toe.  He is also had some drainage.  He attempted to soak his foot in 1 part water and 1 part Clorox last night which caused a lot of burning and increased pain.  He has had history of similar in the past.  Denies any fever.  Denies numbness or tingling.  HPI  Past Medical History:  Diagnosis Date  . 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 Hypertension   . Morbid obesity ([place])   . Sleep apnea     Patient Active Problem List   Diagnosis Date Noted  . Dysphagia 07/03/2018  . Odynophagia   . Malignant neoplasm of prostate ([place]) 11/13/2014    Past Surgical History:  Procedure Laterality Date  . CARPAL TUNNEL RELEASE    . ESOPHAGEAL DILATION    . ESOPHAGOGASTRODUODENOSCOPY (EGD) WITH PROPOFOL N/A 07/03/2018   Procedure: ESOPHAGOGASTRODUODENOSCOPY (EGD) WITH PROPOFOL;  Surgeon: Yetta Flock, MD;  Location[facility];  Service: Gastroenterology;  Laterality: N/A;  . KNEE SURGERY    . PROSTATE BIOPSY  09/09/14   Gleason 6, vol 42.22 cc       Home Medications    Prior to Admission medications   Medication Sig Start Date End Date Taking? Authorizing Provider  cephALEXin (KEFLEX) 500 MG capsule Take 1 capsule (500 mg total) by mouth 4 (four) times daily for 7 days. 04/16/19 04/23/19  Wieters, Hallie C, PA-C  ibuprofen (ADVIL) 800 MG tablet Take 1 tablet (800 mg total) by mouth 3 (three) times daily. 04/16/19   Wieters, Hallie C, PA-C  lansoprazole (PREVACID) 15 MG capsule Take 1 capsule (15 mg total) by mouth 2 (two) times daily. 07/03/18   Armbruster,  Carlota Raspberry, MD  mupirocin ointment (BACTROBAN) 2 % Apply 1 application topically 2 (two) times daily. 04/16/19   Wieters, Hallie C, PA-C  olmesartan-hydrochlorothiazide (BENICAR HCT) 40-25 MG tablet Take 1 tablet by mouth daily. 04/10/19   Vanessa Kick, MD  sucralfate (CARAFATE) 1 GM/10ML suspension Take 10 mLs (1 g total) by mouth 4 (four) times daily. 07/03/18 07/03/19  Armbruster, Carlota Raspberry, MD    Family History Family History  Problem Relation Age of Onset  . Dementia Mother   . Alzheimer's disease Father   . Cancer Father        prostate, surgery  . Cancer Brother        stomach  . Pancreatic cancer Sister     Social History Social History   Tobacco Use  . Smoking status: Never Smoker  . Smokeless tobacco: Never Used  . Tobacco comment: smoked a little in high school  Substance Use Topics  . Alcohol use: Yes    Alcohol/week: 0.0 standard drinks    Comment: socially  . Drug use: No     Allergies   Patient has no known allergies.   Review of Systems Review of Systems  Constitutional: Negative for fatigue and fever.  Eyes: Negative for redness, itching and visual disturbance.  Respiratory: Negative for shortness of breath.   Cardiovascular: Negative for  chest pain and leg swelling.  Gastrointestinal: Negative for nausea and vomiting.  Musculoskeletal: Positive for arthralgias. Negative for myalgias.  Skin: Positive for color change. Negative for rash and wound.  Neurological: Negative for dizziness, syncope, weakness, light-headedness and headaches.     Physical Exam Triage Vital Signs ED Triage Vitals  Enc Vitals Group     BP 04/16/19 1022 129/77     Pulse Rate 04/16/19 1022 76     Resp 04/16/19 1022 18     Temp 04/16/19 1022 98.4 F (36.9 C)     Temp Source 04/16/19 1022 Oral     SpO2 04/16/19 1022 98 %     Weight --      Height --      Head Circumference --      Peak Flow --      Pain Score 04/16/19 1021 0     Pain Loc --      Pain Edu? --      Excl.  in [place]? --    No data found.  Updated Vital Signs BP 129/77 (BP Location: Right Arm)   Pulse 76   Temp 98.4 F (36.9 C) (Oral)   Resp 18   SpO2 98%   Visual Acuity Right Eye Distance:   Left Eye Distance:   Bilateral Distance:    Right Eye Near:   Left Eye Near:    Bilateral Near:     Physical Exam Vitals signs and nursing note reviewed.  Constitutional:      Appearance: He is well-developed.     Comments: No acute distress  HENT:     Head: Normocephalic and atraumatic.     Nose: Nose normal.  Eyes:     Conjunctiva/sclera: Conjunctivae normal.  Neck:     Musculoskeletal: Neck supple.  Cardiovascular:     Rate and Rhythm: Normal rate.  Pulmonary:     Effort: Pulmonary effort is normal. No respiratory distress.  Abdominal:     General: There is no distension.  Musculoskeletal: Normal range of motion.     Comments: Dorsalis pedis 2+ on left foot  Skin:    General: Skin is warm and dry.     Comments: Left foot: Erythema, swelling and pustular drainage to medial nail fold of left great toe, does not extend to plantar surface  Neurological:     Mental Status: He is alert and oriented to person, place, and time.      UC Treatments / Results  Labs (all labs ordered are listed, but only abnormal results are displayed) Labs Reviewed - No data to display  EKG None  Radiology No results found.  Procedures Procedures (including critical care time)  Medications Ordered in UC Medications - No data to display  Initial Impression / Assessment and Plan / UC Course  I have reviewed the triage vital signs and the nursing notes.  Pertinent labs & imaging results that were available during my care of the patient were reviewed by me and considered in my medical decision making (see chart for details).     Patient likely with paronychia secondary to ingrown toenail.  Will treat with Keflex and Bactroban, recommended soaking.  Follow-up if having persistent issues with  ingrown nail after resolution of infection.Discussed strict return precautions. Patient verbalized understanding and is agreeable with plan.  Final Clinical Impressions(s) / UC Diagnoses   Final diagnoses:  Ingrown toenail of left foot  Paronychia of great toe, left     Discharge Instructions  Begin Keflex 4 times a day for the next week Soak toe/foot in warm water twice daily for approximately 20 minutes.  Dry well and apply Bactroban afterwards  May try using dental floss to encourage nail out from skin  If having continued issues please follow-up here or with podiatry, contact information below   ED Prescriptions    Medication Sig Dispense Auth. Provider   cephALEXin (KEFLEX) 500 MG capsule Take 1 capsule (500 mg total) by mouth 4 (four) times daily for 7 days. 28 capsule Wieters, Hallie C, PA-C   mupirocin ointment (BACTROBAN) 2 % Apply 1 application topically 2 (two) times daily. 22 g Wieters, Hallie C, PA-C   ibuprofen (ADVIL) 800 MG tablet Take 1 tablet (800 mg total) by mouth 3 (three) times daily. 21 tablet Wieters, [place] C, PA-C     Controlled Substance Prescriptions [place] Controlled Substance Registry consulted? Not Applicable   Janith Lima, [place] 04/16/19 1041

## 2019-04-16 NOTE — ED Triage Notes (Signed)
Patient presents to Urgent Care with complaints of ingrown toenail and burning since putting a cream on it to help with the ingrown toenail. Patient reports he is not sure what he put on it, it was something his wife had and it did not work.

## 2019-08-01 ENCOUNTER — Ambulatory Visit (HOSPITAL_COMMUNITY)
Admission: EM | Admit: 2019-08-01 | Discharge: 2019-08-01 | Disposition: A | Discharge status: Home / Self Care | Payer: BLUE CROSS/BLUE SHIELD | Attending: Internal Medicine | Admitting: Internal Medicine

## 2019-08-01 ENCOUNTER — Encounter (HOSPITAL_COMMUNITY): Payer: Self-pay

## 2019-08-01 ENCOUNTER — Other Ambulatory Visit: Payer: Self-pay

## 2019-08-01 DIAGNOSIS — I10 Essential (primary) hypertension: Secondary | ICD-10-CM

## 2019-08-01 DIAGNOSIS — Z760 Encounter for issue of repeat prescription: Secondary | ICD-10-CM

## 2019-08-01 MED ORDER — OLMESARTAN MEDOXOMIL-HCTZ 40-25 MG PO TABS: 1.0000 | ORAL_TABLET | Freq: Every day | ORAL | 0 refills | Status: DC

## 2019-08-01 NOTE — ED Triage Notes (Signed)
Pt presents to UC stating he has run out of his BP medications x1 week and needs a refill. Pt denies any symptoms related to hypertension.

## 2019-08-01 NOTE — ED Provider Notes (Signed)
[place]    CSN: [id number] Arrival date & time: 08/01/19  1744      History   Chief Complaint Chief Complaint  Patient presents with  . Medication Refill    HPI Logan Contreras is a 58 y.o. male with a history of adenocarcinoma of the prostate, hypertension comes to urgent care with request for medication refill.  Patient has run out of his antihypertensive medications by week ago.  He denies any headaches, dizziness, chest pain or chest pressure.  No nausea or vomiting.  He does not have any primary care physician at this time would like to establish care with a primary care person.  HPI  Past Medical History:  Diagnosis Date  . 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 Hypertension   . Morbid obesity ([place])   . Sleep apnea     Patient Active Problem List   Diagnosis Date Noted  . Dysphagia 07/03/2018  . Odynophagia   . Malignant neoplasm of prostate ([place]) 11/13/2014    Past Surgical History:  Procedure Laterality Date  . CARPAL TUNNEL RELEASE    . ESOPHAGEAL DILATION    . ESOPHAGOGASTRODUODENOSCOPY (EGD) WITH PROPOFOL N/A 07/03/2018   Procedure: ESOPHAGOGASTRODUODENOSCOPY (EGD) WITH PROPOFOL;  Surgeon: Yetta Flock, MD;  [facility];  Service: Gastroenterology;  Laterality: N/A;  . KNEE SURGERY    . PROSTATE BIOPSY  09/09/14   Gleason 6, vol 42.22 cc       Home Medications    Prior to Admission medications   Medication Sig Start Date End Date Taking? Authorizing Provider  ibuprofen (ADVIL) 800 MG tablet Take 1 tablet (800 mg total) by mouth 3 (three) times daily. 04/16/19   Wieters, Hallie C, PA-C  lansoprazole (PREVACID) 15 MG capsule Take 1 capsule (15 mg total) by mouth 2 (two) times daily. 07/03/18   Armbruster, Carlota Raspberry, MD  mupirocin ointment (BACTROBAN) 2 % Apply 1 application topically 2 (two) times daily. 04/16/19   Wieters, Hallie C, PA-C  olmesartan-hydrochlorothiazide (BENICAR HCT) 40-25 MG  tablet Take 1 tablet by mouth daily. 08/01/19   Lamptey, Myrene Galas, MD  sucralfate (CARAFATE) 1 GM/10ML suspension Take 10 mLs (1 g total) by mouth 4 (four) times daily. 07/03/18 07/03/19  Armbruster, Carlota Raspberry, MD    Family History Family History  Problem Relation Age of Onset  . Dementia Mother   . Alzheimer's disease Father   . Cancer Father        prostate, surgery  . Cancer Brother        stomach  . Pancreatic cancer Sister     Social History Social History   Tobacco Use  . Smoking status: Never Smoker  . Smokeless tobacco: Never Used  . Tobacco comment: smoked a little in high school  Substance Use Topics  . Alcohol use: Yes    Alcohol/week: 0.0 standard drinks    Comment: socially  . Drug use: No     Allergies   Patient has no known allergies.   Review of Systems Review of Systems  Constitutional: Negative.   HENT: Negative.   Respiratory: Negative.   Cardiovascular: Negative.   Gastrointestinal: Negative.   Genitourinary: Negative.   Musculoskeletal: Negative.      Physical Exam Triage Vital Signs ED Triage Vitals  Enc Vitals Group     BP 08/01/19 1821 (!) 142/78     Pulse Rate 08/01/19 1821 70     Resp 08/01/19 1821 16  Temp 08/01/19 1821 98.4 F (36.9 C)     Temp Source 08/01/19 1821 Oral     SpO2 08/01/19 1821 97 %     Weight 08/01/19 1823 267 lb (121.1 kg)     Height --      Head Circumference --      Peak Flow --      Pain Score 08/01/19 1822 0     Pain Loc --      Pain Edu? --      Excl. in [place]? --    No data found.  Updated Vital Signs BP (!) 142/78 (BP Location: Left Arm)   Pulse 70   Temp 98.4 F (36.9 C) (Oral)   Resp 16   Wt 121.1 kg   SpO2 97%   BMI 39.43 kg/m   Visual Acuity Right Eye Distance:   Left Eye Distance:   Bilateral Distance:    Right Eye Near:   Left Eye Near:    Bilateral Near:     Physical Exam Vitals signs and nursing note reviewed.  Constitutional:      Appearance: Normal appearance. He is  not ill-appearing or toxic-appearing.  Cardiovascular:     Pulses: Normal pulses.     Heart sounds: Normal heart sounds.  Pulmonary:     Effort: Pulmonary effort is normal. No respiratory distress.     Breath sounds: Normal breath sounds. No wheezing.  Abdominal:     General: Bowel sounds are normal.     Palpations: Abdomen is soft.  Skin:    Capillary Refill: Capillary refill takes less than 2 seconds.  Neurological:     General: No focal deficit present.     Mental Status: He is alert and oriented to person, place, and time.      UC Treatments / Results  Labs (all labs ordered are listed, but only abnormal results are displayed) Labs Reviewed - No data to display  EKG   Radiology No results found.  Procedures Procedures (including critical care time)  Medications Ordered in UC Medications - No data to display  Initial Impression / Assessment and Plan / UC Course  I have reviewed the triage vital signs and the nursing notes.  Pertinent labs & imaging results that were available during my care of the patient were reviewed by me and considered in my medical decision making (see chart for details).     1.  Medication refill: Benicar HCTZ refill has been sent to the pharmacy. Patient is advised to establish primary care services Referral to [facility] health Internal medicine for primary care services. Final Clinical Impressions(s) / UC Diagnoses   Final diagnoses:  Medication refill   Discharge Instructions   None    ED Prescriptions    Medication Sig Dispense Auth. Provider   olmesartan-hydrochlorothiazide (BENICAR HCT) 40-25 MG tablet Take 1 tablet by mouth daily. 90 tablet Lamptey, Myrene Galas, MD     Controlled Substance Prescriptions [place] Controlled Substance Registry consulted? No   Chase Picket, MD 08/01/19 [phone]

## 2019-09-22 ENCOUNTER — Encounter (HOSPITAL_COMMUNITY): Payer: Self-pay | Admitting: Emergency Medicine

## 2019-09-22 ENCOUNTER — Emergency Department (HOSPITAL_COMMUNITY)
Admission: EM | Admit: 2019-09-22 | Discharge: 2019-09-22 | Disposition: A | Discharge status: Home / Self Care | Payer: BC Managed Care – PPO | Attending: Emergency Medicine | Admitting: Emergency Medicine

## 2019-09-22 ENCOUNTER — Emergency Department (HOSPITAL_COMMUNITY): Payer: BC Managed Care – PPO

## 2019-09-22 ENCOUNTER — Other Ambulatory Visit: Payer: Self-pay

## 2019-09-22 DIAGNOSIS — Z79899 Other long term (current) drug therapy: Secondary | ICD-10-CM | POA: Insufficient documentation

## 2019-09-22 DIAGNOSIS — I10 Essential (primary) hypertension: Secondary | ICD-10-CM | POA: Diagnosis not present

## 2019-09-22 DIAGNOSIS — Y93I9 Activity, other involving external motion: Secondary | ICD-10-CM | POA: Insufficient documentation

## 2019-09-22 DIAGNOSIS — S39012A Strain of muscle, fascia and tendon of lower back, initial encounter: Secondary | ICD-10-CM

## 2019-09-22 DIAGNOSIS — Y92410 Unspecified street and highway as the place of occurrence of the external cause: Secondary | ICD-10-CM | POA: Diagnosis not present

## 2019-09-22 DIAGNOSIS — S3992XA Unspecified injury of lower back, initial encounter: Secondary | ICD-10-CM | POA: Diagnosis present

## 2019-09-22 DIAGNOSIS — Y999 Unspecified external cause status: Secondary | ICD-10-CM | POA: Insufficient documentation

## 2019-09-22 MED ORDER — HYDROCODONE-ACETAMINOPHEN 5-325 MG PO TABS
1.0000 | ORAL_TABLET | ORAL | Status: AC
  Administered 2019-09-22: 1 via ORAL
  Filled 2019-09-22: qty 1

## 2019-09-22 MED ORDER — IBUPROFEN 600 MG PO TABS: 600.0000 mg | ORAL_TABLET | Freq: Three times a day (TID) | ORAL | 0 refills | Status: DC | PRN

## 2019-09-22 MED ORDER — CYCLOBENZAPRINE HCL 10 MG PO TABS: 10.0000 mg | ORAL_TABLET | Freq: Two times a day (BID) | ORAL | 0 refills | Status: DC | PRN
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
## 2019-09-22 NOTE — Discharge Instructions (Addendum)
Take the medications as prescribed.  Follow-up with your doctor if your symptoms are not improving in the next week or so.  Return to the emergency room for weakness, worsening numbness, or other concerning symptoms.  Follow up with your primary care doctor regarding the finding we discussed on the lumbar spine films.  (discuss further evaluation such as bone scan, PSA)

## 2019-09-22 NOTE — ED Notes (Signed)
Pt complaining of Left thigh numbness that has not improved.

## 2019-09-22 NOTE — ED Notes (Signed)
Patient transported to X-ray 

## 2019-09-22 NOTE — ED Notes (Signed)
Pt. Ambulated with no assist. Pt. Ambulated from the bed to the hallway. Pt felt a little dizzy while in a standing position. Nurse aware.
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
## 2019-11-27 ENCOUNTER — Encounter (HOSPITAL_COMMUNITY): Payer: Self-pay

## 2019-11-27 ENCOUNTER — Ambulatory Visit (HOSPITAL_COMMUNITY)
Admission: EM | Admit: 2019-11-27 | Discharge: 2019-11-27 | Disposition: A | Discharge status: Home / Self Care | Payer: BC Managed Care – PPO | Attending: Emergency Medicine | Admitting: Emergency Medicine

## 2019-11-27 ENCOUNTER — Other Ambulatory Visit: Payer: Self-pay

## 2019-11-27 DIAGNOSIS — I10 Essential (primary) hypertension: Secondary | ICD-10-CM

## 2019-11-27 DIAGNOSIS — Z760 Encounter for issue of repeat prescription: Secondary | ICD-10-CM | POA: Diagnosis not present

## 2019-11-27 MED ORDER — OLMESARTAN MEDOXOMIL-HCTZ 40-25 MG PO TABS: 1.0000 | ORAL_TABLET | Freq: Every day | ORAL | 1 refills | Status: DC

## 2019-11-27 NOTE — ED Triage Notes (Signed)
Pt presents for medication refill for blood pressure medication; pt states he has been without it for a few days.

## 2019-11-27 NOTE — Discharge Instructions (Signed)
Decrease your salt intake. diet and exercise will lower your blood pressure significantly. It is important to keep your blood pressure under good control, as having a elevated blood pressure for prolonged periods of time significantly increases your risk of stroke, heart attacks, kidney damage, eye damage, and other problems. Measure your blood pressure once a day, preferably at the same time every day. Keep a log of this and bring it to your next doctor's appointment.  Bring your blood pressure cuff as well.  Return here in 2 weeks for blood pressure recheck if you're unable to find a primary care physician by then. Return immediately to the ER if you start having chest pain, headache, problems seeing, problems talking, problems walking, if you feel like you're about to pass out, if you do pass out, if you have a seizure, or for any other concerns.  Go to www.goodrx.com to look up your medications. This will give you a list of where you can find your prescriptions at the most affordable prices. Or ask the pharmacist what the cash price is, or if they have any other discount programs available to help make your medication more affordable. This can be less expensive than what you would pay with insurance.

## 2019-11-27 NOTE — ED Provider Notes (Signed)
HPI  SUBJECTIVE:  Logan Contreras is a 58 y.o. male who presents for refill of his blood pressure medications.  He said he ran out several days ago.  He has been on it for a long time and states that it controls his blood pressure well.  Patient is asymptomatic.  He has a past medical history of hypertension, adenocarcinoma of the prostate.  PMD: He is establishing care with someone on 12/29.    Past Medical History:  Diagnosis Date  . 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 Hypertension   . Morbid obesity ([place])   . Sleep apnea     Past Surgical History:  Procedure Laterality Date  . CARPAL TUNNEL RELEASE    . ESOPHAGEAL DILATION    . ESOPHAGOGASTRODUODENOSCOPY (EGD) WITH PROPOFOL N/A 07/03/2018   Procedure: ESOPHAGOGASTRODUODENOSCOPY (EGD) WITH PROPOFOL;  Surgeon: Yetta Flock, MD;  Location: [facility];  Service: Gastroenterology;  Laterality: N/A;  . KNEE SURGERY    . PROSTATE BIOPSY  09/09/14   Gleason 6, vol 42.22 cc    Family History  Problem Relation Age of Onset  . Dementia Mother   . Alzheimer's disease Father   . Cancer Father        prostate, surgery  . Cancer Brother        stomach  . Pancreatic cancer Sister     Social History   Tobacco Use  . Smoking status: Never Smoker  . Smokeless tobacco: Never Used  . Tobacco comment: smoked a little in high school  Substance Use Topics  . Alcohol use: Yes    Alcohol/week: 0.0 standard drinks    Comment: socially  . Drug use: No    No current facility-administered medications for this encounter.  Current Outpatient Medications:  Marland Kitchen  Multiple Vitamins-Minerals (ZINC PO), Take 1 tablet by mouth daily., Disp: , Rfl:  .  olmesartan-hydrochlorothiazide (BENICAR HCT) 40-25 MG tablet, Take 1 tablet by mouth daily., Disp: 30 tablet, Rfl: 1  No Known Allergies   ROS  As noted in HPI.   Physical Exam  BP 138/77 (BP Location: Right Arm)   Pulse 77   Temp 98.4 F (36.9  C) (Oral)   Resp 18   SpO2 98%   Constitutional: Well developed, well nourished, no acute distress Eyes:  EOMI, conjunctiva normal bilaterally HENT: Normocephalic, atraumatic,mucus membranes moist Respiratory: Normal inspiratory effort, lungs clear bilaterally Cardiovascular: Normal rate regular rhythm no murmurs rubs gallops GI: nondistended skin: No rash, skin intact Musculoskeletal: no deformities Neurologic: Alert & oriented x 3, no focal neuro deficits Psychiatric: Speech and behavior appropriate   ED Course   Medications - No data to display  No orders of the defined types were placed in this encounter.   No results found for this or any previous visit (from the past 24 hour(s)). No results found.  ED Clinical Impression  1. Essential hypertension   2. Medication refill      ED Assessment/Plan  Patient asymptomatic.  Blood pressure normal today.  Will refill Benicar 1 months worth with 1 refill in case something happens in between here and his primary care appointment on 12/29.  Advised patient to keep a log of his blood pressure and bring it with him to his primary care appointment.  Gave patient hypertensive emergency ER return precautions.  Patient agrees with plan.   Meds ordered this encounter  Medications  . olmesartan-hydrochlorothiazide (BENICAR HCT) 40-25 MG tablet  Sig: Take 1 tablet by mouth daily.    Dispense:  30 tablet    Refill:  1    *This clinic note was created using L[employer]. Therefore, there may be occasional mistakes despite careful proofreading.   ?    Melynda Ripple, MD 11/28/19 [phone]

## 2020-02-08 ENCOUNTER — Encounter (HOSPITAL_COMMUNITY): Payer: Self-pay

## 2020-02-08 ENCOUNTER — Other Ambulatory Visit: Payer: Self-pay

## 2020-02-08 ENCOUNTER — Emergency Department (HOSPITAL_COMMUNITY): Payer: BC Managed Care – PPO

## 2020-02-08 ENCOUNTER — Emergency Department (HOSPITAL_COMMUNITY)
Admission: EM | Admit: 2020-02-08 | Discharge: 2020-02-08 | Disposition: A | Discharge status: Home / Self Care | Payer: BC Managed Care – PPO | Attending: Emergency Medicine | Admitting: Emergency Medicine

## 2020-02-08 DIAGNOSIS — M899 Disorder of bone, unspecified: Secondary | ICD-10-CM | POA: Insufficient documentation

## 2020-02-08 DIAGNOSIS — I10 Essential (primary) hypertension: Secondary | ICD-10-CM | POA: Diagnosis not present

## 2020-02-08 DIAGNOSIS — R1031 Right lower quadrant pain: Secondary | ICD-10-CM | POA: Insufficient documentation

## 2020-02-08 DIAGNOSIS — R109 Unspecified abdominal pain: Secondary | ICD-10-CM

## 2020-02-08 LAB — BASIC METABOLIC PANEL
Anion gap: 8 (ref 5–15)
BUN: 10 mg/dL (ref 6–20)
CO2: 31 mmol/L (ref 22–32)
Calcium: 9.4 mg/dL (ref 8.9–10.3)
Chloride: 101 mmol/L (ref 98–111)
Creatinine, Ser: 0.94 mg/dL (ref 0.61–1.24)
GFR calc Af Amer: >60 mL/min (ref >60)
GFR calc non Af Amer: >60 mL/min (ref >60)
Glucose, Bld: 100 mg/dL — ABNORMAL HIGH (ref 70–99)
Potassium: 4 mmol/L (ref 3.5–5.1)
Sodium: 140 mmol/L (ref 135–145)

## 2020-02-08 LAB — CBC
HCT: 47.6 % (ref 39.0–52.0)
Hemoglobin: 15.5 g/dL (ref 13.0–17.0)
MCH: 27.6 pg (ref 26.0–34.0)
MCHC: 32.6 g/dL (ref 30.0–36.0)
MCV: 84.8 fL (ref 80.0–100.0)
Platelets: 227 10*3/uL (ref 150–400)
RBC: 5.61 MIL/uL (ref 4.22–5.81)
RDW: 13.4 % (ref 11.5–15.5)
WBC: 4.8 10*3/uL (ref 4.0–10.5)
nRBC: 0 % (ref 0.0–0.2)

## 2020-02-08 MED ORDER — HYDROCODONE-ACETAMINOPHEN 5-325 MG PO TABS
1.0000 | ORAL_TABLET | Freq: Once | ORAL | Status: AC
  Administered 2020-02-08: 12:00:00 1 via ORAL
  Filled 2020-02-08: qty 1

## 2020-02-08 MED ORDER — IOHEXOL 300 MG/ML  SOLN
100.0000 mL | Freq: Once | INTRAMUSCULAR | Status: AC | PRN
  Administered 2020-02-08: 100 mL via INTRAVENOUS

## 2020-02-08 MED ORDER — SODIUM CHLORIDE (PF) 0.9 % IJ SOLN
INTRAMUSCULAR | Status: AC
  Filled 2020-02-08: qty 50
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
## 2020-03-06 ENCOUNTER — Emergency Department (HOSPITAL_COMMUNITY): Payer: BC Managed Care – PPO

## 2020-03-06 ENCOUNTER — Encounter (HOSPITAL_COMMUNITY): Payer: Self-pay | Admitting: Emergency Medicine

## 2020-03-06 ENCOUNTER — Emergency Department (HOSPITAL_COMMUNITY)
Admission: EM | Admit: 2020-03-06 | Discharge: 2020-03-06 | Disposition: A | Discharge status: Home / Self Care | Payer: BC Managed Care – PPO | Attending: Emergency Medicine | Admitting: Emergency Medicine

## 2020-03-06 DIAGNOSIS — Y92410 Unspecified street and highway as the place of occurrence of the external cause: Secondary | ICD-10-CM | POA: Insufficient documentation

## 2020-03-06 DIAGNOSIS — M545 Low back pain, unspecified: Secondary | ICD-10-CM

## 2020-03-06 DIAGNOSIS — Y999 Unspecified external cause status: Secondary | ICD-10-CM | POA: Insufficient documentation

## 2020-03-06 DIAGNOSIS — I10 Essential (primary) hypertension: Secondary | ICD-10-CM | POA: Insufficient documentation

## 2020-03-06 DIAGNOSIS — Z79899 Other long term (current) drug therapy: Secondary | ICD-10-CM | POA: Diagnosis not present

## 2020-03-06 DIAGNOSIS — M25562 Pain in left knee: Secondary | ICD-10-CM

## 2020-03-06 DIAGNOSIS — Y93I9 Activity, other involving external motion: Secondary | ICD-10-CM | POA: Diagnosis not present

## 2020-03-06 MED ORDER — ACETAMINOPHEN 325 MG PO TABS
650.0000 mg | ORAL_TABLET | Freq: Once | ORAL | Status: AC
  Administered 2020-03-06: 16:00:00 650 mg via ORAL
  Filled 2020-03-06: qty 2

## 2020-03-06 MED ORDER — CYCLOBENZAPRINE HCL 10 MG PO TABS: 10.0000 mg | ORAL_TABLET | Freq: Every day | ORAL | 0 refills | Status: DC

## 2020-03-06 NOTE — Discharge Instructions (Addendum)
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 Your xrays for your knee and back did NOT show any fractures. Take your Flexeril at night and Tylenol as needed. Come back to the ER for worsening pain, changes in walking, trouble urinating, vision changes, altered mental status, severe headache. Follow up with your PCP in three days.
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
## 2020-03-06 NOTE — ED Notes (Signed)
Patient walked out of room and said he needed to leave because his wife was here, declined repeat vitals at this time.
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
## 2020-10-02 ENCOUNTER — Other Ambulatory Visit: Payer: Self-pay

## 2020-10-02 ENCOUNTER — Ambulatory Visit (HOSPITAL_COMMUNITY)
Admission: EM | Admit: 2020-10-02 | Discharge: 2020-10-02 | Disposition: A | Discharge status: Home / Self Care | Payer: BC Managed Care – PPO | Attending: Internal Medicine | Admitting: Internal Medicine

## 2020-10-02 DIAGNOSIS — Z20822 Contact with and (suspected) exposure to covid-19: Secondary | ICD-10-CM | POA: Insufficient documentation

## 2020-10-02 NOTE — ED Triage Notes (Signed)
Patient requesting a covid test.  Needed to attend a sporting event.  No symptoms

## 2020-10-02 NOTE — Discharge Instructions (Signed)

## 2020-10-03 LAB — SARS CORONAVIRUS 2 (TAT 6-24 HRS): SARS Coronavirus 2: NEGATIVE

## 2020-10-13 ENCOUNTER — Ambulatory Visit (INDEPENDENT_AMBULATORY_CARE_PROVIDER_SITE_OTHER): Payer: BC Managed Care – PPO | Admitting: Podiatry

## 2020-10-13 ENCOUNTER — Encounter: Payer: Self-pay | Admitting: Podiatry

## 2020-10-13 ENCOUNTER — Other Ambulatory Visit: Payer: Self-pay

## 2020-10-13 DIAGNOSIS — L600 Ingrowing nail: Secondary | ICD-10-CM

## 2020-10-13 MED ORDER — DOXYCYCLINE HYCLATE 100 MG PO TABS: 100.0000 mg | ORAL_TABLET | Freq: Two times a day (BID) | ORAL | 0 refills | Status: DC

## 2020-10-13 NOTE — Patient Instructions (Signed)

## 2020-10-15 NOTE — Progress Notes (Signed)
Subjective:   Patient ID: Logan Contreras, male   DOB: 59 y.o.   MRN: [id number]   HPI Patient presents stating that he is developing a very painful ingrown toenail and that he had some bleeding he has tried to trim it himself and soak it and it is very hard to wear shoe gear comfortably.  States he has had this ongoing and its just gotten more difficult over the last couple years   Review of Systems  All other systems reviewed and are negative.       Objective:  Physical Exam Vitals and nursing note reviewed.  Constitutional:      Appearance: He is well-developed.  Pulmonary:     Effort: Pulmonary effort is normal.  Musculoskeletal:        General: Normal range of motion.  Skin:    General: Skin is warm.  Neurological:     Mental Status: He is alert.     Neurovascular status intact muscle strength was found to be adequate range of motion adequate.  Patient is found to have an incurvated left hallux medial border that is painful when pressed make shoe gear difficult with localized redness but no active drainage noted currently.  Patient is found to have good digital perfusion well oriented x3     Assessment:  Ingrown toenail deformity left hallux with pain localized with redness but no drainage     Plan:  H&P reviewed condition and recommended correction.  I explained procedure risk and allowed patient to sign consent form.  Today I infiltrated the left hallux 60 mg like Marcaine mixture sterile prep done and using sterile instrumentation I remove the border exposed matrix applied phenol 3 applications 30 seconds followed by alcohol lavage sterile dressing gave instructions for soaks and to leave dressing on 24 hours but take it off early if any throbbing were to occur.  Patient will be seen back to recheck and is encouraged to call questions concerns which may arise

## 2020-12-06 ENCOUNTER — Other Ambulatory Visit: Payer: Self-pay

## 2020-12-06 ENCOUNTER — Ambulatory Visit (HOSPITAL_COMMUNITY)
Admission: EM | Admit: 2020-12-06 | Discharge: 2020-12-06 | Disposition: A | Discharge status: Home / Self Care | Payer: BC Managed Care – PPO | Attending: Family Medicine | Admitting: Family Medicine

## 2020-12-06 DIAGNOSIS — U071 COVID-19: Secondary | ICD-10-CM | POA: Diagnosis not present

## 2020-12-06 DIAGNOSIS — Z20822 Contact with and (suspected) exposure to covid-19: Secondary | ICD-10-CM

## 2020-12-06 LAB — SARS CORONAVIRUS 2 (TAT 6-24 HRS): SARS Coronavirus 2: POSITIVE — AB

## 2020-12-06 NOTE — Discharge Instructions (Signed)

## 2020-12-06 NOTE — ED Triage Notes (Signed)
Patient has had fever and chills since Thursday.  Patient reports daughter is positive for covid.   Patient declines seeing provider this visit.  Requesting covid test for work.

## 2020-12-07 ENCOUNTER — Telehealth: Payer: Self-pay | Admitting: Physician Assistant

## 2020-12-07 NOTE — Telephone Encounter (Signed)
Called to discuss with patient about Covid symptoms and the use of a monoclonal antibody infusion for those with mild to moderate Covid symptoms and at a high risk of hospitalization.   Pt is qualified for the monoclonal antibody infusion, but due to medication shortages we cannot offer the pt the infusion at this time. This decision was based on clinical judgement as well as the the NIH Covid 19 treatment guidelines for treatment prioritization and equitable access. Symptoms tier reviewed as well as criteria for ending isolation. Preventative practices reviewed. Patient verbalized understanding.   Pt feeling much better at this time.    Cline Crock, PA-C  12/07/2020 12:55 PM

## 2021-03-24 ENCOUNTER — Other Ambulatory Visit: Payer: Self-pay

## 2021-03-24 ENCOUNTER — Ambulatory Visit (HOSPITAL_COMMUNITY)
Admission: EM | Admit: 2021-03-24 | Discharge: 2021-03-24 | Disposition: A | Discharge status: Home / Self Care | Payer: BC Managed Care – PPO | Attending: Emergency Medicine | Admitting: Emergency Medicine

## 2021-03-24 ENCOUNTER — Encounter (HOSPITAL_COMMUNITY): Payer: Self-pay | Admitting: Emergency Medicine

## 2021-03-24 DIAGNOSIS — Z792 Long term (current) use of antibiotics: Secondary | ICD-10-CM | POA: Insufficient documentation

## 2021-03-24 DIAGNOSIS — J3489 Other specified disorders of nose and nasal sinuses: Secondary | ICD-10-CM | POA: Diagnosis not present

## 2021-03-24 DIAGNOSIS — Z20822 Contact with and (suspected) exposure to covid-19: Secondary | ICD-10-CM | POA: Insufficient documentation

## 2021-03-24 DIAGNOSIS — Z79899 Other long term (current) drug therapy: Secondary | ICD-10-CM | POA: Diagnosis not present

## 2021-03-24 DIAGNOSIS — J069 Acute upper respiratory infection, unspecified: Secondary | ICD-10-CM | POA: Diagnosis not present

## 2021-03-24 DIAGNOSIS — R051 Acute cough: Secondary | ICD-10-CM | POA: Diagnosis present

## 2021-03-24 LAB — SARS CORONAVIRUS 2 (TAT 6-24 HRS): SARS Coronavirus 2: NEGATIVE

## 2021-03-24 NOTE — ED Triage Notes (Signed)
PT C/O: cold sx onset 1 week associated w/runny nose, coughing, fever, headache  DENIES: v/n/d  TAKING MEDS: OTC Formula 44, Ibuprofen, Hot tea, cough drops  A&O x4... NAD... Ambulatory

## 2021-03-24 NOTE — ED Provider Notes (Signed)
[facility]    CSN: [id number] Arrival date & time: 03/24/21  0856      History   Chief Complaint Chief Complaint  Patient presents with  . URI    HPI Antoni Stefan is a 60 y.o. male.   Patient presents with non productive cough, rhinorrhea, congestion, intermittent generalized headache for 1 week. Fever present within the first 2 days. Has resolved. Denies chills, body aches, sore throat, abdominal pain, nausea, vomiting, diarrhea. Taking otc medication or holistic treatment with some relief.   Past Medical History:  Diagnosis Date  . 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 Hypertension   . Morbid obesity ([place])   . Sleep apnea     Patient Active Problem List   Diagnosis Date Noted  . Dysphagia 07/03/2018  . Odynophagia   . Malignant neoplasm of prostate ([place]) 11/13/2014    Past Surgical History:  Procedure Laterality Date  . CARPAL TUNNEL RELEASE    . ESOPHAGEAL DILATION    . ESOPHAGOGASTRODUODENOSCOPY (EGD) WITH PROPOFOL N/A 07/03/2018   Procedure: ESOPHAGOGASTRODUODENOSCOPY (EGD) WITH PROPOFOL;  Surgeon: Yetta Flock, MD;  Location: [facility];  Service: Gastroenterology;  Laterality: N/A;  . KNEE SURGERY    . PROSTATE BIOPSY  09/09/14   Gleason 6, vol 42.22 cc       Home Medications    Prior to Admission medications   Medication Sig Start Date End Date Taking? Authorizing Provider  cyclobenzaprine (FLEXERIL) 10 MG tablet Take 1 tablet (10 mg total) by mouth at bedtime. 03/06/20   Alfredia Client, PA-C  doxycycline (VIBRA-TABS) 100 MG tablet Take 1 tablet (100 mg total) by mouth 2 (two) times daily. 10/13/20   Wallene Huh, DPM  Multiple Vitamins-Minerals (ZINC PO) Take 1 tablet by mouth daily.    [provider]  olmesartan-hydrochlorothiazide (BENICAR HCT) 40-25 MG tablet Take 1 tablet by mouth daily. 11/27/19   Melynda Ripple, MD  sucralfate (CARAFATE) 1 GM/10ML suspension Take 10 mLs (1 g  total) by mouth 4 (four) times daily. Patient not taking: Reported on 09/22/2019 07/03/18 11/27/19  Armbruster, Carlota Raspberry, MD    Family History Family History  Problem Relation Age of Onset  . Dementia Mother   . Alzheimer's disease Father   . Cancer Father        prostate, surgery  . Cancer Brother        stomach  . Pancreatic cancer Sister     Social History Social History   Tobacco Use  . Smoking status: Never Smoker  . Smokeless tobacco: Never Used  . Tobacco comment: smoked a little in high school  Vaping Use  . Vaping Use: Never used  Substance Use Topics  . Alcohol use: Not Currently    Alcohol/week: 0.0 standard drinks    Comment: socially  . Drug use: No     Allergies   Patient has no known allergies.   Review of Systems Review of Systems  Refer to HPI   Physical Exam Triage Vital Signs ED Triage Vitals  Enc Vitals Group     BP 03/24/21 0910 (!) 144/88     Pulse Rate 03/24/21 0910 79     Resp 03/24/21 0910 16     Temp 03/24/21 0910 98.2 F (36.8 C)     Temp Source 03/24/21 0910 Oral     SpO2 03/24/21 0910 97 %     Weight --      Height --  Head Circumference --      Peak Flow --      Pain Score 03/24/21 0911 0     Pain Loc --      Pain Edu? --      Excl. in [place]? --    No data found.  Updated Vital Signs BP (!) 144/88 (BP Location: Right Arm)   Pulse 79   Temp 98.2 F (36.8 C) (Oral)   Resp 16   SpO2 97%   Visual Acuity Right Eye Distance:   Left Eye Distance:   Bilateral Distance:    Right Eye Near:   Left Eye Near:    Bilateral Near:     Physical Exam Constitutional:      Appearance: Normal appearance. He is obese.  HENT:     Head: Normocephalic.     Right Ear: Tympanic membrane, ear canal and external ear normal.     Left Ear: Tympanic membrane, ear canal and external ear normal.     Nose: Congestion and rhinorrhea present.     Mouth/Throat:     Mouth: Mucous membranes are moist.     Pharynx: Oropharynx is clear.   Eyes:     Conjunctiva/sclera: Conjunctivae normal.     Pupils: Pupils are equal, round, and reactive to light.  Cardiovascular:     Rate and Rhythm: Normal rate and regular rhythm.     Pulses: Normal pulses.     Heart sounds: Normal heart sounds.  Pulmonary:     Effort: Pulmonary effort is normal.     Breath sounds: Normal breath sounds.  Musculoskeletal:        General: Normal range of motion.     Cervical back: Normal range of motion.  Skin:    General: Skin is warm and dry.  Neurological:     General: No focal deficit present.     Mental Status: He is alert and oriented to person, place, and time. Mental status is at baseline.  Psychiatric:        Mood and Affect: Mood normal.        Behavior: Behavior normal.        Thought Content: Thought content normal.        Judgment: Judgment normal.      UC Treatments / Results  Labs (all labs ordered are listed, but only abnormal results are displayed) Labs Reviewed  SARS CORONAVIRUS 2 (TAT 6-24 HRS)    EKG   Radiology No results found.  Procedures Procedures (including critical care time)  Medications Ordered in UC Medications - No data to display  Initial Impression / Assessment and Plan / UC Course  I have reviewed the triage vital signs and the nursing notes.  Pertinent labs & imaging results that were available during my care of the patient were reviewed by me and considered in my medical decision making (see chart for details).  Viral URI with cough  1. Covid- pending 2.  Continue use of otc medications for comfort  Final Clinical Impressions(s) / UC Diagnoses   Final diagnoses:  Viral URI with cough     Discharge Instructions     Can continue use any over the counter medication that provides you comfort  Covid test pending 24 hours, you will be called if positive    ED Prescriptions    None     PDMP not reviewed this encounter.   Hans Eden, NP 03/24/21 1054

## 2021-03-24 NOTE — Discharge Instructions (Signed)
Can continue use any over the counter medication that provides you comfort  Covid test pending 24 hours, you will be called if positive

## 2021-05-14 ENCOUNTER — Ambulatory Visit (INDEPENDENT_AMBULATORY_CARE_PROVIDER_SITE_OTHER): Payer: BC Managed Care – PPO

## 2021-05-14 ENCOUNTER — Other Ambulatory Visit: Payer: Self-pay

## 2021-05-14 ENCOUNTER — Ambulatory Visit (HOSPITAL_COMMUNITY)
Admission: EM | Admit: 2021-05-14 | Discharge: 2021-05-14 | Disposition: A | Discharge status: Home / Self Care | Payer: BC Managed Care – PPO | Attending: Physician Assistant | Admitting: Physician Assistant

## 2021-05-14 ENCOUNTER — Encounter (HOSPITAL_COMMUNITY): Payer: Self-pay

## 2021-05-14 DIAGNOSIS — M25562 Pain in left knee: Secondary | ICD-10-CM

## 2021-05-14 DIAGNOSIS — G8929 Other chronic pain: Secondary | ICD-10-CM

## 2021-05-14 DIAGNOSIS — M1712 Unilateral primary osteoarthritis, left knee: Secondary | ICD-10-CM | POA: Diagnosis not present

## 2021-05-14 MED ORDER — MELOXICAM 15 MG PO TABS: 15.0000 mg | ORAL_TABLET | Freq: Every day | ORAL | 0 refills | Status: DC

## 2021-05-14 NOTE — ED Triage Notes (Signed)
Pt c/o knee pain and lower back pain. He states he has been having little mobility on the left knee and states it is aching. Pt states sitting down helps reduce the knee pain.

## 2021-05-14 NOTE — ED Provider Notes (Signed)
[place]    CSN: [id number] Arrival date & time: 05/14/21  1713      History   Chief Complaint Chief Complaint  Patient presents with   Knee Pain    HPI Logan Contreras is a 60 y.o. male.   Patient presents today with a several month history of worsening left knee pain.  He denies known injury or increase in activity prior to symptom onset.  Reports a history of meniscal injury requiring arthroscopy in 2015.  States symptoms are similar to this episode.  Pain is rated 6 on a 0-10 pain scale, localized to medial left knee without radiation, described as aching, worse with prolonged standing or ambulation, no relieving factors identified.  He has tried Tylenol and ibuprofen without improvement of symptoms.  He has been using a knee brace which has provided some stability but not improved pain.  He reports popping and catching.  He has not fallen despite the symptoms.  He is not currently followed by orthopedics but is open to seeing specialist.  He denies any weakness, numbness, paresthesias.   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   Gleason 6, vol 42.22 cc   Hypertension    Morbid obesity ([place])    Sleep apnea     Patient Active Problem List   Diagnosis Date Noted   Dysphagia 07/03/2018   Odynophagia    Malignant neoplasm of prostate ([place]) 11/13/2014    Past Surgical History:  Procedure Laterality Date   CARPAL TUNNEL RELEASE     ESOPHAGEAL DILATION     ESOPHAGOGASTRODUODENOSCOPY (EGD) WITH PROPOFOL N/A 07/03/2018   Procedure: ESOPHAGOGASTRODUODENOSCOPY (EGD) WITH PROPOFOL;  Surgeon: Yetta Flock, MD;  Location: [facility];  Service: Gastroenterology;  Laterality: N/A;   KNEE SURGERY     PROSTATE BIOPSY  09/09/14   Gleason 6, vol 42.22 cc       Home Medications    Prior to Admission medications   Medication Sig Start Date End Date Taking? Authorizing Provider  meloxicam (MOBIC) 15 MG tablet Take 1  tablet (15 mg total) by mouth daily. 05/14/21  Yes Ahava Kissoon K, PA-C  cyclobenzaprine (FLEXERIL) 10 MG tablet Take 1 tablet (10 mg total) by mouth at bedtime. 03/06/20   Alfredia Client, PA-C  doxycycline (VIBRA-TABS) 100 MG tablet Take 1 tablet (100 mg total) by mouth 2 (two) times daily. 10/13/20   Wallene Huh, DPM  Multiple Vitamins-Minerals (ZINC PO) Take 1 tablet by mouth daily.    [provider]  olmesartan-hydrochlorothiazide (BENICAR HCT) 40-25 MG tablet Take 1 tablet by mouth daily. 11/27/19   Melynda Ripple, MD  sucralfate (CARAFATE) 1 GM/10ML suspension Take 10 mLs (1 g total) by mouth 4 (four) times daily. Patient not taking: Reported on 09/22/2019 07/03/18 11/27/19  Armbruster, Carlota Raspberry, MD    Family History Family History  Problem Relation Age of Onset   Dementia Mother    Alzheimer's disease Father    Cancer Father        prostate, surgery   Cancer Brother        stomach   Pancreatic cancer Sister     Social History Social History   Tobacco Use   Smoking status: Never   Smokeless tobacco: Never   Tobacco comments:    smoked a little in high school  Vaping Use   Vaping Use: Never used  Substance Use Topics   Alcohol use: Not Currently    Alcohol/week: 0.0  standard drinks    Comment: socially   Drug use: No     Allergies   Patient has no known allergies.   Review of Systems Review of Systems  Constitutional:  Positive for activity change. Negative for appetite change, fatigue and fever.  Respiratory:  Negative for cough and shortness of breath.   Cardiovascular:  Negative for chest pain.  Gastrointestinal:  Negative for abdominal pain, diarrhea, nausea and vomiting.  Musculoskeletal:  Positive for arthralgias, back pain, gait problem, joint swelling and myalgias.  Neurological:  Negative for dizziness, weakness, light-headedness, numbness and headaches.    Physical Exam Triage Vital Signs ED Triage Vitals  Enc Vitals Group     BP  05/14/21 1851 140/88     Pulse Rate 05/14/21 1851 76     Resp 05/14/21 1851 19     Temp 05/14/21 1851 99.1 F (37.3 C)     Temp Source 05/14/21 1851 Oral     SpO2 05/14/21 1851 97 %     Weight --      Height --      Head Circumference --      Peak Flow --      Pain Score 05/14/21 1849 6     Pain Loc --      Pain Edu? --      Excl. in [place]? --    No data found.  Updated Vital Signs BP 140/88 (BP Location: Right Arm)   Pulse 76   Temp 99.1 F (37.3 C) (Oral)   Resp 19   SpO2 97%   Visual Acuity Right Eye Distance:   Left Eye Distance:   Bilateral Distance:    Right Eye Near:   Left Eye Near:    Bilateral Near:     Physical Exam Vitals reviewed.  Constitutional:      General: He is awake.     Appearance: Normal appearance. He is normal weight. He is not ill-appearing.     Comments: Very pleasant male appears stated age in no acute distress  HENT:     Head: Normocephalic and atraumatic.  Cardiovascular:     Rate and Rhythm: Normal rate and regular rhythm.     Pulses:          Posterior tibial pulses are 2+ on the right side and 2+ on the left side.     Heart sounds: Normal heart sounds, S1 normal and S2 normal. No murmur heard. Pulmonary:     Effort: Pulmonary effort is normal.     Breath sounds: Normal breath sounds. No stridor. No wheezing, rhonchi or rales.     Comments: Clear to auscultation bilaterally Abdominal:     General: Bowel sounds are normal.     Palpations: Abdomen is soft.     Tenderness: There is no abdominal tenderness.  Musculoskeletal:     Left knee: Effusion present. No swelling. Decreased range of motion. Tenderness present over the medial joint line. No LCL laxity, MCL laxity, ACL laxity or PCL laxity.    Comments: Tenderness to palpation at medial joint line.  Pain with flexion.  No ligamentous laxity on exam.  Moderate effusion noted.  Neurological:     Mental Status: He is alert.  Psychiatric:        Behavior: Behavior is cooperative.      UC Treatments / Results  Labs (all labs ordered are listed, but only abnormal results are displayed) Labs Reviewed - No data to display  EKG   Radiology DG  Knee Complete 4 Views Left  Result Date: 05/14/2021 CLINICAL DATA:  Chronic medial left knee pain. EXAM: LEFT KNEE - COMPLETE 4+ VIEW COMPARISON:  March 06, 2020 FINDINGS: No evidence of fracture, dislocation. Minimal suprapatellar effusion is identified. Minimal tibial spine osteophytosis is unchanged. Mild decreased several tibial joint space is unchanged. Soft tissues are unremarkable. IMPRESSION: Minimal degenerative joint changes of left knee, stable. Electronically Signed   By: Abelardo Diesel M.D.   On: 05/14/2021 20:45    Procedures Procedures (including critical care time)  Medications Ordered in UC Medications - No data to display  Initial Impression / Assessment and Plan / UC Course  I have reviewed the triage vital signs and the nursing notes.  Pertinent labs & imaging results that were available during my care of the patient were reviewed by me and considered in my medical decision making (see chart for details).      X-ray showed degenerative changes without acute findings.  Discussed this finding with patient as well as concern for possible meniscal injury given clinical presentation.  Recommended that he follow-up with orthopedics and he was given contact information for orthopedic provider and after visit summary.  He was prescribed meloxicam to be taken daily with instructions take additional NSAIDs with this medication.  Recommended he continue using brace to help with pain and stabilization.  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  Discussed alarm symptoms or warrant emergent evaluation.  Strict return precautions given to which patient expressed understanding.  Final Clinical Impressions(s)  / UC Diagnoses   Final diagnoses:  Chronic pain of left knee  Primary osteoarthritis of left knee     Discharge Instructions      Your x-ray did show osteoarthritis which is the likely cause of your pain.  I have called in meloxicam to be taken daily.  This is an NSAID so you should not take additional ibuprofen, aspirin, Aleve with this medication.  Continue with your knee brace.  I do recommend that you follow-up with orthopedics as we discussed as you will likely need injections and further work-up.  If you have any worsening symptoms please return for reevaluation.     ED Prescriptions     Medication Sig Dispense Auth. Provider   meloxicam (MOBIC) 15 MG tablet Take 1 tablet (15 mg total) by mouth daily. 20 tablet Jahvier Aldea, Derry Skill, PA-C      PDMP not reviewed this encounter.   Terrilee Croak, PA-C 05/14/21 2056

## 2021-05-14 NOTE — Discharge Instructions (Signed)
Your x-ray did show osteoarthritis which is the likely cause of your pain.  I have called in meloxicam to be taken daily.  This is an NSAID so you should not take additional ibuprofen, aspirin, Aleve with this medication.  Continue with your knee brace.  I do recommend that you follow-up with orthopedics as we discussed as you will likely need injections and further work-up.  If you have any worsening symptoms please return for reevaluation.

## 2021-09-21 ENCOUNTER — Ambulatory Visit (HOSPITAL_COMMUNITY)
Admission: EM | Admit: 2021-09-21 | Discharge: 2021-09-21 | Disposition: A | Discharge status: Home / Self Care | Payer: BC Managed Care – PPO | Attending: Physician Assistant | Admitting: Physician Assistant

## 2021-09-21 ENCOUNTER — Encounter (HOSPITAL_COMMUNITY): Payer: Self-pay

## 2021-09-21 ENCOUNTER — Other Ambulatory Visit: Payer: Self-pay

## 2021-09-21 DIAGNOSIS — Z760 Encounter for issue of repeat prescription: Secondary | ICD-10-CM

## 2021-09-21 DIAGNOSIS — I10 Essential (primary) hypertension: Secondary | ICD-10-CM

## 2021-09-21 DIAGNOSIS — R030 Elevated blood-pressure reading, without diagnosis of hypertension: Secondary | ICD-10-CM

## 2021-09-21 MED ORDER — OLMESARTAN MEDOXOMIL-HCTZ 40-25 MG PO TABS: 1.0000 | ORAL_TABLET | Freq: Every day | ORAL | 1 refills | Status: AC

## 2021-09-21 NOTE — Discharge Instructions (Signed)
Please restart your medication.  Monitor blood pressure at home.  If this is persistently elevated above 140/90 after being on the medication for several weeks please return for reevaluation.  Monitor your diet for salt continue with healthy eating.  If you develop any chest pain, shortness of breath, headache, dizziness, vision changes in the setting of high blood pressure you need to go to the emergency room.  I do recommend he follow-up with either our clinic or your primary care provider within a few weeks to ensure blood pressure has normalized.

## 2021-09-21 NOTE — ED Provider Notes (Signed)
[facility]    CSN: [id number] Arrival date & time: 09/21/21  1735      History   Chief Complaint Chief Complaint  Patient presents with   Hypertension    HPI Logan Contreras is a 60 y.o. male.   Patient presents today with a several day history of mild headache.  He reports this has significantly improved and essentially subsided but this made him concerned that his blood pressure was elevated.  He does have a history of essential hypertension and was previously prescribed olmesartan/hydrochlorothiazide combination but has not been on this medication for several months as he ran out of it.  He has tried to make several healthy lifestyle changes including eating a vegan diet and trying to exercise more but continues to have elevated blood pressure readings.  He is unsure if there is a family history of hypertension as both his parents are deceased.  He tries to avoid salt and exercise regularly.  Denies any recent increase in caffeine consumption or decongestant use.  He reports headache is significantly improved over the past several hours and he denies any additional symptoms including vision changes, dizziness, chest pain, shortness of breath.   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   Gleason 6, vol 42.22 cc   Hypertension    Morbid obesity ([place])    Sleep apnea     Patient Active Problem List   Diagnosis Date Noted   Dysphagia 07/03/2018   Odynophagia    Malignant neoplasm of prostate ([place]) 11/13/2014    Past Surgical History:  Procedure Laterality Date   CARPAL TUNNEL RELEASE     ESOPHAGEAL DILATION     ESOPHAGOGASTRODUODENOSCOPY (EGD) WITH PROPOFOL N/A 07/03/2018   Procedure: ESOPHAGOGASTRODUODENOSCOPY (EGD) WITH PROPOFOL;  Surgeon: Yetta Flock, MD;  Location: [facility];  Service: Gastroenterology;  Laterality: N/A;   KNEE SURGERY     PROSTATE BIOPSY  09/09/14   Gleason 6, vol 42.22 cc       Home  Medications    Prior to Admission medications   Medication Sig Start Date End Date Taking? Authorizing Provider  cyclobenzaprine (FLEXERIL) 10 MG tablet Take 1 tablet (10 mg total) by mouth at bedtime. 03/06/20   Alfredia Client, PA-C  doxycycline (VIBRA-TABS) 100 MG tablet Take 1 tablet (100 mg total) by mouth 2 (two) times daily. 10/13/20   Wallene Huh, DPM  meloxicam (MOBIC) 15 MG tablet Take 1 tablet (15 mg total) by mouth daily. 05/14/21   Sidrah Harden, Derry Skill, PA-C  Multiple Vitamins-Minerals (ZINC PO) Take 1 tablet by mouth daily.    [provider]  olmesartan-hydrochlorothiazide (BENICAR HCT) 40-25 MG tablet Take 1 tablet by mouth daily. 09/21/21   Shenise Wolgamott, Derry Skill, PA-C  sucralfate (CARAFATE) 1 GM/10ML suspension Take 10 mLs (1 g total) by mouth 4 (four) times daily. Patient not taking: Reported on 09/22/2019 07/03/18 11/27/19  Armbruster, Carlota Raspberry, MD    Family History Family History  Problem Relation Age of Onset   Dementia Mother    Alzheimer's disease Father    Cancer Father        prostate, surgery   Cancer Brother        stomach   Pancreatic cancer Sister     Social History Social History   Tobacco Use   Smoking status: Never   Smokeless tobacco: Never   Tobacco comments:    smoked a little in high school  Vaping Use   Vaping  Use: Never used  Substance Use Topics   Alcohol use: Not Currently    Alcohol/week: 0.0 standard drinks    Comment: socially   Drug use: No     Allergies   Patient has no known allergies.   Review of Systems Review of Systems  Constitutional:  Negative for activity change, appetite change, fatigue and fever.  Eyes:  Negative for photophobia and visual disturbance.  Respiratory:  Negative for cough and shortness of breath.   Cardiovascular:  Negative for chest pain, palpitations and leg swelling.  Gastrointestinal:  Negative for abdominal pain, diarrhea, nausea and vomiting.  Neurological:  Positive for headaches. Negative for  dizziness and light-headedness.    Physical Exam Triage Vital Signs ED Triage Vitals  Enc Vitals Group     BP 09/21/21 1911 (!) 151/85     Pulse Rate 09/21/21 1911 79     Resp 09/21/21 1911 18     Temp 09/21/21 1911 98.6 F (37 C)     Temp Source 09/21/21 1911 Oral     SpO2 09/21/21 1911 99 %     Weight --      Height --      Head Circumference --      Peak Flow --      Pain Score 09/21/21 1910 8     Pain Loc --      Pain Edu? --      Excl. in [place]? --    No data found.  Updated Vital Signs BP (!) 151/85 (BP Location: Right Arm)   Pulse 79   Temp 98.6 F (37 C) (Oral)   Resp 18   SpO2 99%   Visual Acuity Right Eye Distance:   Left Eye Distance:   Bilateral Distance:    Right Eye Near:   Left Eye Near:    Bilateral Near:     Physical Exam Vitals reviewed.  Constitutional:      General: He is awake.     Appearance: Normal appearance. He is well-developed. He is not ill-appearing.     Comments: Very pleasant male appears stated age no acute distress sitting comfortably in exam room  HENT:     Head: Normocephalic and atraumatic.  Cardiovascular:     Rate and Rhythm: Normal rate and regular rhythm.     Heart sounds: Normal heart sounds, S1 normal and S2 normal. No murmur heard. Pulmonary:     Effort: Pulmonary effort is normal.     Breath sounds: Normal breath sounds. No stridor. No wheezing, rhonchi or rales.     Comments: Clear to auscultation bilaterally Musculoskeletal:     Right lower leg: No edema.     Left lower leg: No edema.  Neurological:     Mental Status: He is alert.  Psychiatric:        Behavior: Behavior is cooperative.     UC Treatments / Results  Labs (all labs ordered are listed, but only abnormal results are displayed) Labs Reviewed  BASIC METABOLIC PANEL    EKG   Radiology No results found.  Procedures Procedures (including critical care time)  Medications Ordered in UC Medications - No data to display  Initial  Impression / Assessment and Plan / UC Course  I have reviewed the triage vital signs and the nursing notes.  Pertinent labs & imaging results that were available during my care of the patient were reviewed by me and considered in my medical decision making (see chart for details).  Blood pressure is elevated today.  Patient denies any current signs/symptoms of endorgan damage.  We discussed that if he has elevated blood pressure and recurrence of headache or development of additional symptoms such as chest pain, shortness of breath, vision changes, leg swelling, dizziness needs to go to the emergency room.  We will restart olmesartan/chlorothiazide combination as previously prescribed.  BMP was obtained today-results pending and we will make adjustments based on laboratory results.  Discussed the importance of healthy lifestyle interventions including monitoring his diet for salt and exercising regularly.  He does not currently have a primary care provider so we will try to establish him with 1 via nursing PCP referral.  Discussed the importance of following up with either PCP or our clinic within 1 to 2 weeks to ensure blood pressure has improved and for medication adjustment.  Discussed alarm symptoms that warrant emergent evaluation.  Strict return precautions given to which he expressed understanding.  Final Clinical Impressions(s) / UC Diagnoses   Final diagnoses:  Essential hypertension  Medication refill  Elevated blood pressure reading     Discharge Instructions      Please restart your medication.  Monitor blood pressure at home.  If this is persistently elevated above 140/90 after being on the medication for several weeks please return for reevaluation.  Monitor your diet for salt continue with healthy eating.  If you develop any chest pain, shortness of breath, headache, dizziness, vision changes in the setting of high blood pressure you need to go to the emergency room.  I do  recommend he follow-up with either our clinic or your primary care provider within a few weeks to ensure blood pressure has normalized.     ED Prescriptions     Medication Sig Dispense Auth. Provider   olmesartan-hydrochlorothiazide (BENICAR HCT) 40-25 MG tablet Take 1 tablet by mouth daily. 30 tablet Bailee Metter, Derry Skill, PA-C      PDMP not reviewed this encounter.   Terrilee Croak, PA-C 09/21/21 2012

## 2021-09-21 NOTE — ED Triage Notes (Signed)
Pt states his BP has been elevated lately. Pt c/o headache x 3 days.

## 2022-02-24 ENCOUNTER — Ambulatory Visit (HOSPITAL_COMMUNITY): Payer: BC Managed Care – PPO

## 2022-02-24 ENCOUNTER — Ambulatory Visit (HOSPITAL_COMMUNITY)
Admission: EM | Admit: 2022-02-24 | Discharge: 2022-02-24 | Disposition: A | Discharge status: Home / Self Care | Payer: No Typology Code available for payment source | Attending: Family Medicine | Admitting: Family Medicine

## 2022-02-24 ENCOUNTER — Encounter (HOSPITAL_COMMUNITY): Payer: Self-pay

## 2022-02-24 ENCOUNTER — Ambulatory Visit (INDEPENDENT_AMBULATORY_CARE_PROVIDER_SITE_OTHER): Payer: No Typology Code available for payment source

## 2022-02-24 DIAGNOSIS — M25562 Pain in left knee: Secondary | ICD-10-CM | POA: Diagnosis not present

## 2022-02-24 DIAGNOSIS — S161XXA Strain of muscle, fascia and tendon at neck level, initial encounter: Secondary | ICD-10-CM

## 2022-02-24 MED ORDER — DICLOFENAC SODIUM 75 MG PO TBEC: 75.0000 mg | DELAYED_RELEASE_TABLET | Freq: Two times a day (BID) | ORAL | 0 refills | Status: DC

## 2022-02-24 MED ORDER — CYCLOBENZAPRINE HCL 10 MG PO TABS: ORAL_TABLET | ORAL | 0 refills | Status: AC
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
## 2022-02-25 ENCOUNTER — Other Ambulatory Visit: Payer: Self-pay | Admitting: Family Medicine

## 2022-02-25 ENCOUNTER — Ambulatory Visit: Payer: Self-pay

## 2022-02-25 ENCOUNTER — Other Ambulatory Visit: Payer: Self-pay

## 2022-02-25 DIAGNOSIS — M25511 Pain in right shoulder: Secondary | ICD-10-CM

## 2022-02-25 DIAGNOSIS — M545 Low back pain, unspecified: Secondary | ICD-10-CM
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
## 2022-10-17 IMAGING — DX DG SHOULDER 2+V*R*
3 series · 3 of 3 positions shown · non-contrast
Comparison: None.

CLINICAL DATA: Right shoulder pain.

EXAM:
RIGHT SHOULDER - 2+ VIEW

[shoulder grashey]
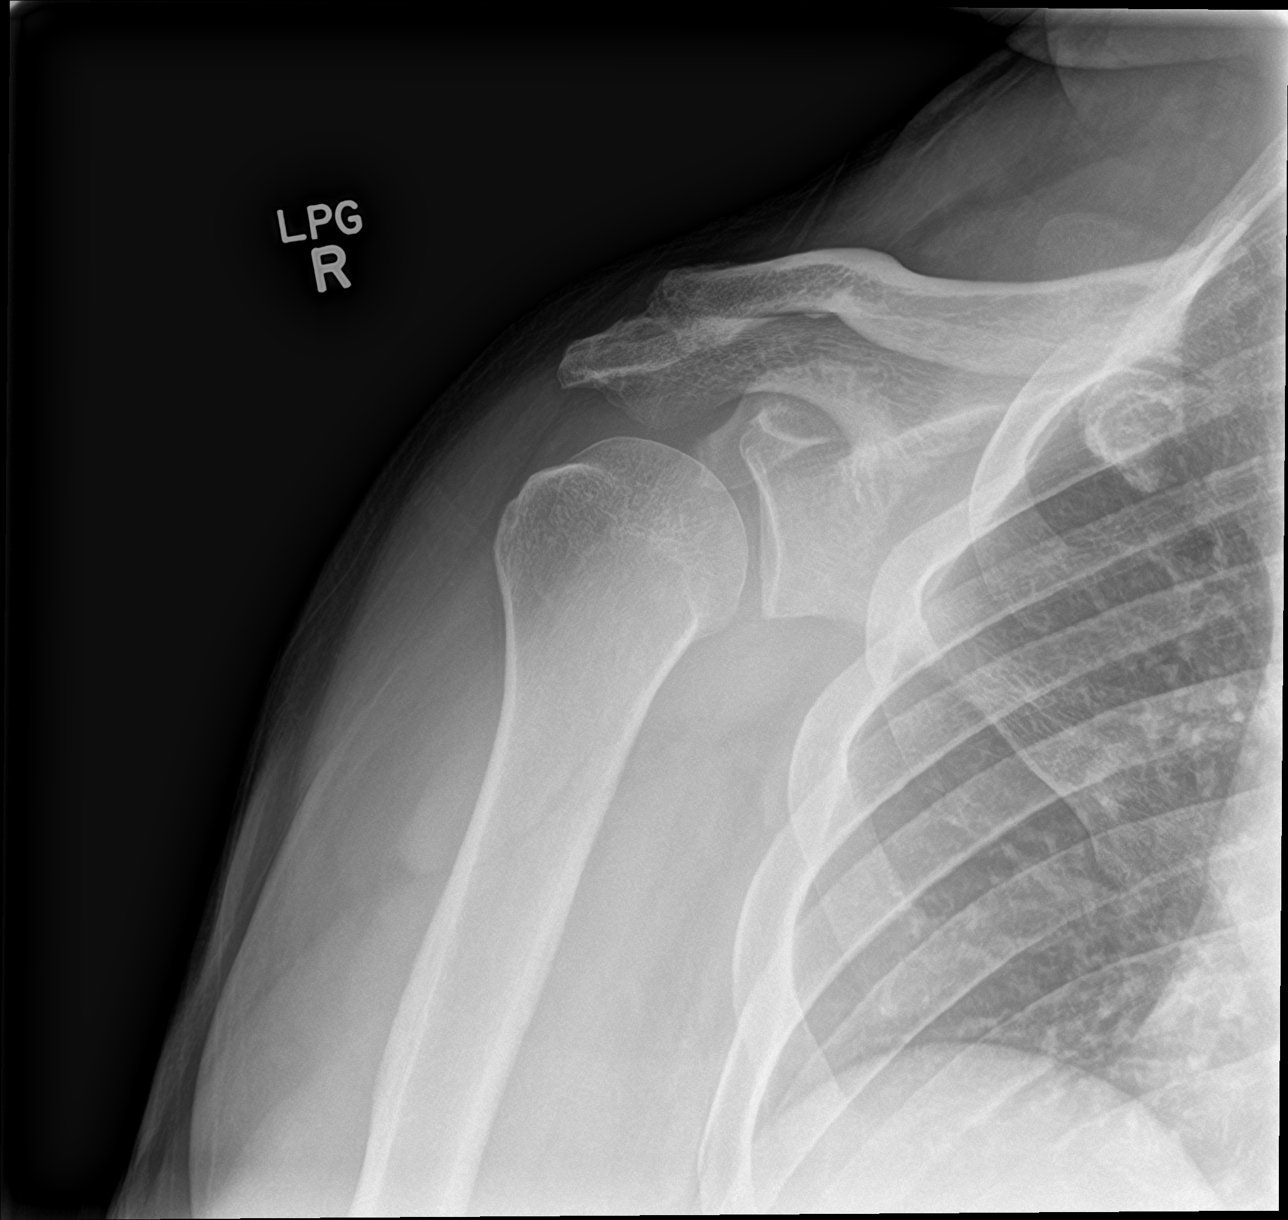

[shoulder y-view]
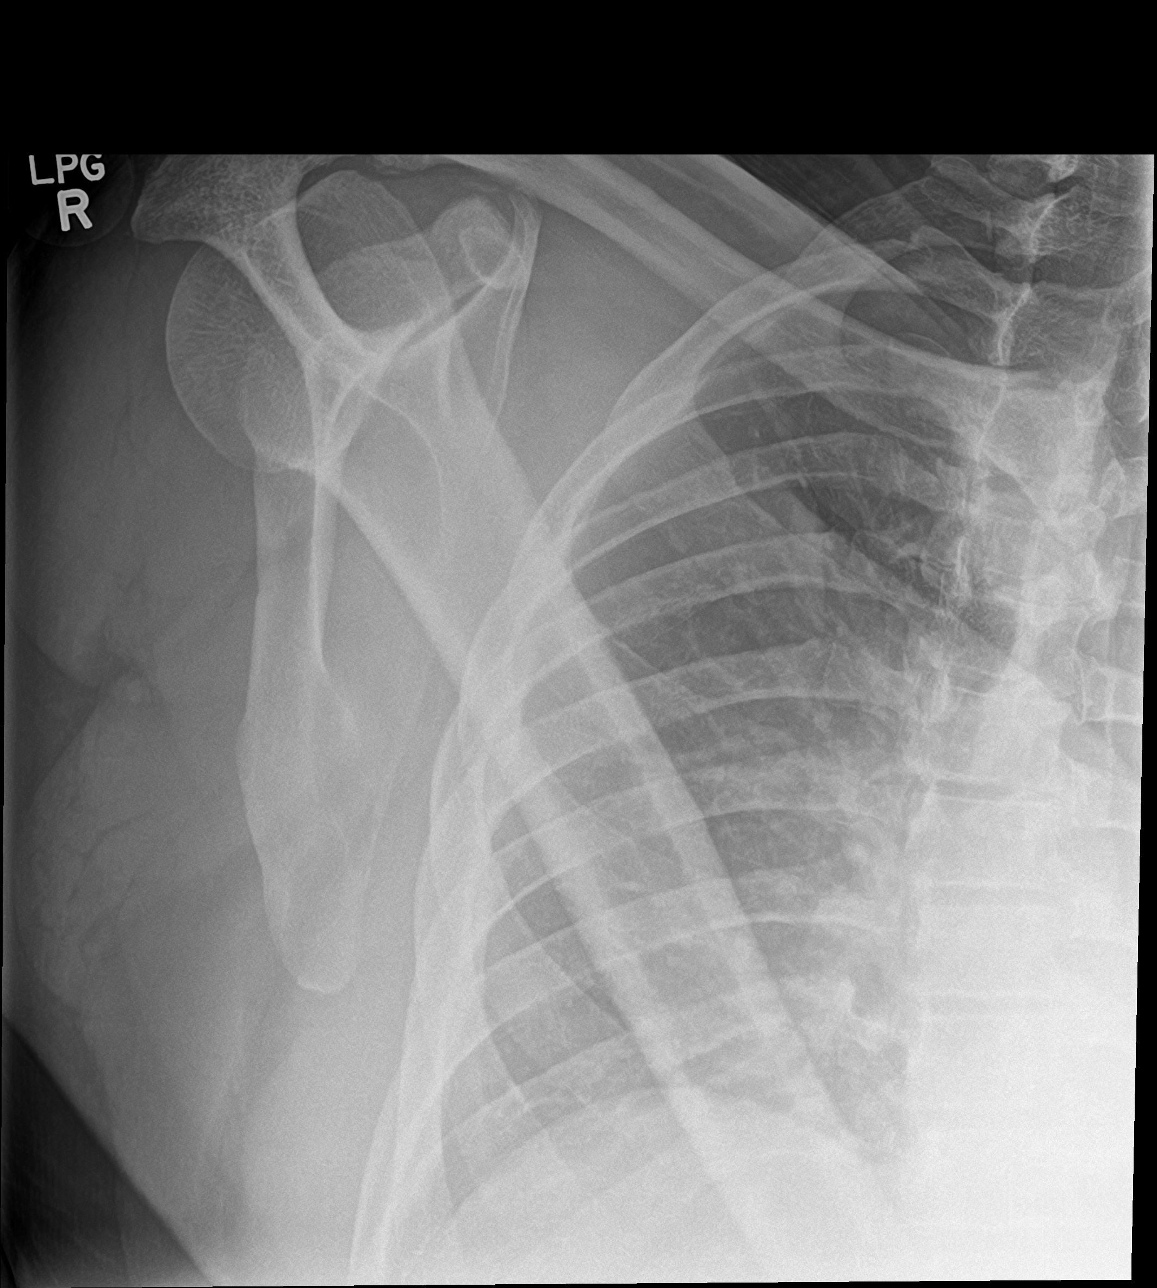

[shoulder axial]
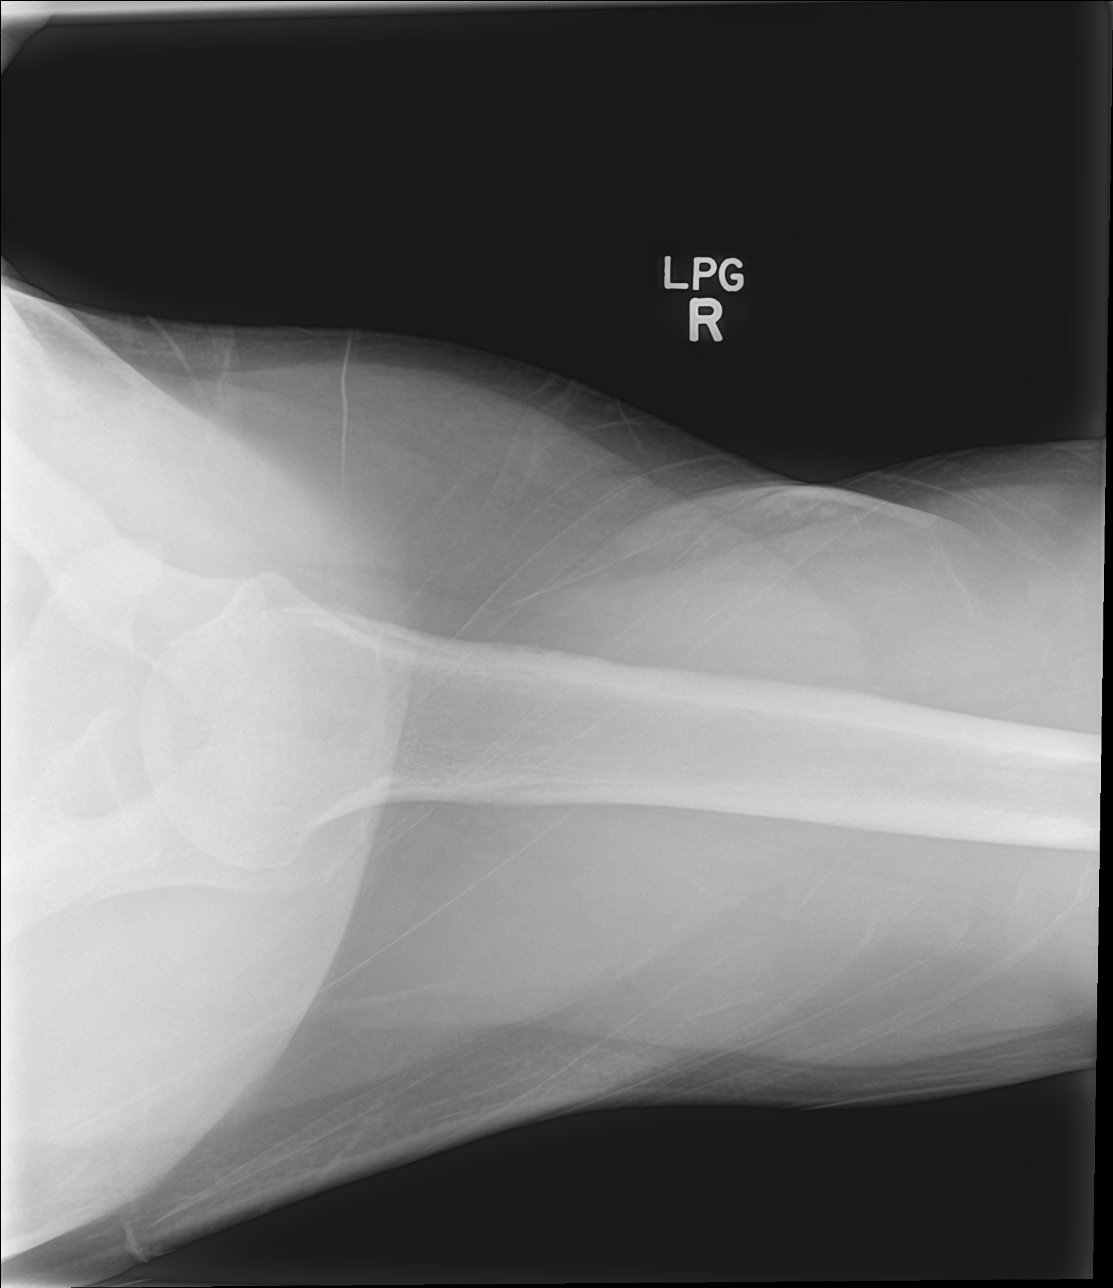

[3 of 3 positions shown; findings below may reference images not displayed]

FINDINGS: Mild acromioclavicular joint space narrowing and peripheral
osteophytosis. The glenohumeral joint space is maintained. No acute
fracture or dislocation. The visualized portion of the right lung is
unremarkable.
IMPRESSION: Mild acromioclavicular osteoarthritis.

## 2022-11-17 ENCOUNTER — Emergency Department (HOSPITAL_COMMUNITY)
Admission: EM | Admit: 2022-11-17 | Discharge: 2022-11-17 | Disposition: A | Discharge status: Home / Self Care | Payer: No Typology Code available for payment source | Attending: Emergency Medicine | Admitting: Emergency Medicine

## 2022-11-17 ENCOUNTER — Emergency Department (HOSPITAL_COMMUNITY): Payer: No Typology Code available for payment source

## 2022-11-17 ENCOUNTER — Encounter (HOSPITAL_COMMUNITY): Payer: Self-pay | Admitting: Emergency Medicine

## 2022-11-17 DIAGNOSIS — S20212A Contusion of left front wall of thorax, initial encounter: Secondary | ICD-10-CM

## 2022-11-17 DIAGNOSIS — S20219A Contusion of unspecified front wall of thorax, initial encounter: Secondary | ICD-10-CM | POA: Diagnosis not present

## 2022-11-17 DIAGNOSIS — S20309A Unspecified superficial injuries of unspecified front wall of thorax, initial encounter: Secondary | ICD-10-CM | POA: Diagnosis present

## 2022-11-17 DIAGNOSIS — S40012A Contusion of left shoulder, initial encounter: Secondary | ICD-10-CM | POA: Insufficient documentation

## 2022-11-17 DIAGNOSIS — Y92410 Unspecified street and highway as the place of occurrence of the external cause: Secondary | ICD-10-CM | POA: Insufficient documentation

## 2022-11-17 DIAGNOSIS — I10 Essential (primary) hypertension: Secondary | ICD-10-CM
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
## 2022-11-17 NOTE — Discharge Instructions (Addendum)
X-rays look normal today.  Take Tylenol or ibuprofen as needed for aches and pains.  Or you can use a heating pad and muscle rubs.

## 2022-12-20 ENCOUNTER — Ambulatory Visit (HOSPITAL_COMMUNITY)
Admission: EM | Admit: 2022-12-20 | Discharge: 2022-12-20 | Disposition: A | Discharge status: Home / Self Care | Payer: BC Managed Care – PPO | Attending: Emergency Medicine | Admitting: Emergency Medicine

## 2022-12-20 ENCOUNTER — Encounter (HOSPITAL_COMMUNITY): Payer: Self-pay

## 2022-12-20 DIAGNOSIS — R0781 Pleurodynia: Secondary | ICD-10-CM | POA: Diagnosis not present

## 2022-12-20 DIAGNOSIS — M25529 Pain in unspecified elbow: Secondary | ICD-10-CM

## 2022-12-20 DIAGNOSIS — M79601 Pain in right arm: Secondary | ICD-10-CM

## 2022-12-20 DIAGNOSIS — M79621 Pain in right upper arm: Secondary | ICD-10-CM

## 2022-12-20 DIAGNOSIS — M79622 Pain in left upper arm: Secondary | ICD-10-CM | POA: Diagnosis not present

## 2022-12-20 MED ORDER — IBUPROFEN 800 MG PO TABS: 800.0000 mg | ORAL_TABLET | Freq: Three times a day (TID) | ORAL | 0 refills | Status: AC

## 2022-12-20 NOTE — Discharge Instructions (Signed)
Discussed with patient he will need to follow-up with orthopedic for further scans and time off work. Will need to take NSAIDs and Tylenol as needed for pain
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
## 2023-03-22 ENCOUNTER — Encounter (HOSPITAL_BASED_OUTPATIENT_CLINIC_OR_DEPARTMENT_OTHER): Payer: Self-pay | Admitting: Emergency Medicine

## 2023-03-22 ENCOUNTER — Other Ambulatory Visit: Payer: Self-pay

## 2023-03-22 ENCOUNTER — Emergency Department (HOSPITAL_BASED_OUTPATIENT_CLINIC_OR_DEPARTMENT_OTHER)
Admission: EM | Admit: 2023-03-22 | Discharge: 2023-03-22 | Discharge status: Against Medical Advice | Payer: BC Managed Care – PPO | Attending: Emergency Medicine | Admitting: Emergency Medicine

## 2023-03-22 ENCOUNTER — Emergency Department (HOSPITAL_BASED_OUTPATIENT_CLINIC_OR_DEPARTMENT_OTHER): Payer: BC Managed Care – PPO | Admitting: Radiology

## 2023-03-22 DIAGNOSIS — Z5321 Procedure and treatment not carried out due to patient leaving prior to being seen by health care provider: Secondary | ICD-10-CM | POA: Diagnosis not present

## 2023-03-22 DIAGNOSIS — Y92410 Unspecified street and highway as the place of occurrence of the external cause: Secondary | ICD-10-CM | POA: Diagnosis not present

## 2023-03-22 DIAGNOSIS — M25562 Pain in left knee: Secondary | ICD-10-CM | POA: Insufficient documentation
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
## 2023-04-30 ENCOUNTER — Ambulatory Visit (HOSPITAL_COMMUNITY)
Admission: EM | Admit: 2023-04-30 | Discharge: 2023-04-30 | Disposition: A | Discharge status: Home / Self Care | Payer: BC Managed Care – PPO | Attending: Internal Medicine | Admitting: Internal Medicine

## 2023-04-30 ENCOUNTER — Encounter (HOSPITAL_COMMUNITY): Payer: Self-pay

## 2023-04-30 DIAGNOSIS — M25562 Pain in left knee: Secondary | ICD-10-CM
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
## 2023-04-30 NOTE — Discharge Instructions (Signed)
Your knee exam looks great today.  Apply heat 20 minutes on 20 minutes off to the left knee to reduce muscle spasm.  If you choose to, you may use 3 over-the-counter ibuprofen pills (200 mg pills totaling 600 mg) every 6 hours as needed for pain and inflammation.  Wear the knee brace applied in clinic for compression and stability.  Schedule an appointment with [facility] health employee health and wellness for further evaluation and to fill out Worker's Compensation paperwork.  If you develop any new or worsening symptoms or do not improve in the next 2 to 3 days, please return.  If your symptoms are severe, please go to the emergency room.  Follow-up with your primary care provider for further evaluation and management of your symptoms as well as ongoing wellness visits.  I hope you feel better!
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
## 2023-06-21 LAB — AMB RESULTS CONSOLE CBG: Glucose: 94

## 2023-06-29 ENCOUNTER — Encounter: Payer: Self-pay | Admitting: *Deleted

## 2023-06-29 NOTE — Progress Notes (Signed)
Pt attended 06/21/23 screening event where his b/p was 129/77 and his blood sugar was 94. At the event, the pt noted he did not have a PCP and he did not identify any SDOH insecurities. [facility] Chart review does not indicate any PCP documentation or encounters. Calls made x 3 to pt to determine need for PCP and share options, but unable to contact pt by phone. Letter sent to pt with [facility] and [facility] as options for him to connect to PCP.

## 2023-07-26 LAB — HEMOGLOBIN A1C: Hemoglobin A1C: 5.5

## 2023-07-26 LAB — GLUCOSE, POCT (MANUAL RESULT ENTRY): Glucose Fasting, POC: 113 mg/dL — AB (ref 70–99)

## 2023-07-26 NOTE — Progress Notes (Signed)
SDOH declined

## 2023-08-02 ENCOUNTER — Encounter: Payer: Self-pay | Admitting: *Deleted

## 2023-08-02 NOTE — Progress Notes (Unsigned)
Pt attended his 2nd screening event this year, on 07/26/23, where his b/p was 138/91; his blood sugar was 113; and his A1C was 5.5. At the event, the pt did not identify a PCP and did not identify any SDOH insecurities. Chart review indicates pt was seen at 06/21/23 event and health equity team was unable to contact him at that time to f/u his need for a PCP, so PCP resources were mailed to him 06/29/23.  Event CMA noted on 07/26/23 that pt was "waiting for insurance with employer before establishing with a PCP" but event health screening form notes pt has private insurance. Calls made again x 3 w/ vm left each time to try to reach pt to support PCP connections, possible insurance support. Letter mailed with [facility] flyer and [facility] info again, in case needed by pt to establish care with a PC, and Medicaid and Financial Assistance options in case pt is still unable to access employer insurance.

## 2024-01-17 ENCOUNTER — Encounter: Payer: Self-pay | Admitting: *Deleted

## 2024-01-17 NOTE — Progress Notes (Signed)
Pt attended 2 screening events, one on 06/21/23 where his b/p was 129/77 and his blood sugar was 94, and one on 8/202/24 where his b/p was 138/91 and his blood sugar was 113 and his A1C was 5.5. At each event pt did not note PCP name and did not indicate he had insurance, did note not being a smoker and did not identify any SDOH insecurites. After each initial event f/u and one 60 day f/u, health equity team unable to contact pt to confirm PCP status, although at second event, pt shared he was waiting for a job and insurance to establish care with a PCP However chart review to date does not reveal any [facility]-visible healthcare encounters, including PCP visits. During this final event f/u attempt, since unable to verify PCP and insurance status with pt or per chart review, final letter sent with PCP info ([facility] and [facility] flyers) as well as Medicaid, [employer]CA, and financial assistance info in case still needed by pt. No additional health equity team support scheduled at this time.
